# Patient Record
Sex: Male | Born: 1955 | Race: White | Hispanic: No | Marital: Married | Smoking: Never smoker
Health system: Southern US, Community
[De-identification: ages and names within clinical notes are randomized; demographics above are authoritative.]

## PROBLEM LIST (undated history)

## (undated) DIAGNOSIS — Z981 Arthrodesis status: Secondary | ICD-10-CM

## (undated) DIAGNOSIS — E669 Obesity, unspecified: Secondary | ICD-10-CM

## (undated) DIAGNOSIS — M1712 Unilateral primary osteoarthritis, left knee: Secondary | ICD-10-CM

## (undated) DIAGNOSIS — G4730 Sleep apnea, unspecified: Secondary | ICD-10-CM

## (undated) DIAGNOSIS — D123 Benign neoplasm of transverse colon: Secondary | ICD-10-CM

## (undated) DIAGNOSIS — E8881 Metabolic syndrome: Secondary | ICD-10-CM

## (undated) DIAGNOSIS — K621 Rectal polyp: Secondary | ICD-10-CM

## (undated) DIAGNOSIS — E785 Hyperlipidemia, unspecified: Secondary | ICD-10-CM

## (undated) DIAGNOSIS — I10 Essential (primary) hypertension: Secondary | ICD-10-CM

## (undated) DIAGNOSIS — M1990 Unspecified osteoarthritis, unspecified site: Secondary | ICD-10-CM

## (undated) DIAGNOSIS — M4800 Spinal stenosis, site unspecified: Secondary | ICD-10-CM

## (undated) HISTORY — DX: Hyperlipidemia, unspecified: E78.5

## (undated) HISTORY — PX: SPLENECTOMY: SUR1306

## (undated) HISTORY — DX: Essential (primary) hypertension: I10

## (undated) HISTORY — PX: OTHER SURGICAL HISTORY: SHX169

## (undated) HISTORY — PX: TOTAL HIP ARTHROPLASTY: SHX124

---

## 1977-05-07 HISTORY — PX: OTHER SURGICAL HISTORY: SHX169

## 1977-05-07 HISTORY — PX: SPLENECTOMY: SUR1306

## 2011-07-23 ENCOUNTER — Ambulatory Visit: Payer: Self-pay | Admitting: General Practice

## 2011-07-23 LAB — URINALYSIS, COMPLETE
Bacteria: NONE SEEN
Bilirubin,UR: NEGATIVE
Glucose,UR: NEGATIVE mg/dL (ref 0–75)
Ketone: NEGATIVE
Ph: 5 (ref 4.5–8.0)
Specific Gravity: 1.023 (ref 1.003–1.030)
Squamous Epithelial: 1

## 2011-07-23 LAB — PROTIME-INR: Prothrombin Time: 12.3 secs (ref 11.5–14.7)

## 2011-07-23 LAB — BASIC METABOLIC PANEL
Calcium, Total: 8.8 mg/dL (ref 8.5–10.1)
Co2: 25 mmol/L (ref 21–32)
Creatinine: 0.89 mg/dL (ref 0.60–1.30)
EGFR (African American): >60
EGFR (Non-African Amer.): >60
Glucose: 109 mg/dL — ABNORMAL HIGH (ref 65–99)
Osmolality: 289 (ref 275–301)
Sodium: 144 mmol/L (ref 136–145)

## 2011-07-23 LAB — CBC
MCH: 31.6 pg (ref 26.0–34.0)
MCHC: 33.7 g/dL (ref 32.0–36.0)
Platelet: 245 10*3/uL (ref 150–440)
RBC: 4.91 10*6/uL (ref 4.40–5.90)
RDW: 12 % (ref 11.5–14.5)
WBC: 7.5 10*3/uL (ref 3.8–10.6)

## 2011-07-23 LAB — SEDIMENTATION RATE: Erythrocyte Sed Rate: 6 mm/hr (ref 0–20)

## 2011-07-24 LAB — URINE CULTURE

## 2011-08-08 ENCOUNTER — Inpatient Hospital Stay: Payer: Self-pay | Admitting: General Practice

## 2011-08-09 LAB — BASIC METABOLIC PANEL
Calcium, Total: 8 mg/dL — ABNORMAL LOW (ref 8.5–10.1)
Chloride: 106 mmol/L (ref 98–107)
Co2: 23 mmol/L (ref 21–32)
Creatinine: 0.94 mg/dL (ref 0.60–1.30)
EGFR (African American): >60
Glucose: 113 mg/dL — ABNORMAL HIGH (ref 65–99)
Osmolality: 281 (ref 275–301)
Potassium: 4.2 mmol/L (ref 3.5–5.1)

## 2011-08-10 LAB — BASIC METABOLIC PANEL
BUN: 11 mg/dL (ref 7–18)
Calcium, Total: 8 mg/dL — ABNORMAL LOW (ref 8.5–10.1)
EGFR (African American): >60
EGFR (Non-African Amer.): >60
Glucose: 101 mg/dL — ABNORMAL HIGH (ref 65–99)
Potassium: 3.9 mmol/L (ref 3.5–5.1)
Sodium: 138 mmol/L (ref 136–145)

## 2011-08-10 LAB — PLATELET COUNT: Platelet: 209 10*3/uL (ref 150–440)

## 2013-03-05 ENCOUNTER — Ambulatory Visit: Payer: Self-pay | Admitting: Family Medicine

## 2013-04-18 ENCOUNTER — Ambulatory Visit: Payer: Self-pay | Admitting: General Practice

## 2013-07-14 ENCOUNTER — Ambulatory Visit: Payer: Self-pay | Admitting: Anesthesiology

## 2013-07-17 ENCOUNTER — Ambulatory Visit: Payer: Self-pay | Admitting: General Practice

## 2014-08-24 LAB — LIPID PANEL
Cholesterol: 225 mg/dL — AB (ref 0–200)
HDL: 50 mg/dL (ref 35–70)
LDL CALC: 111 mg/dL
Triglycerides: 321 mg/dL — AB (ref 40–160)

## 2014-08-24 LAB — BASIC METABOLIC PANEL
Creatinine: 0.9 mg/dL (ref <=1.3)
Glucose: 93 mg/dL

## 2014-08-28 NOTE — Op Note (Signed)
PATIENT NAME:  Ralph Giles, ROBACK MR#:  [id number] DATE OF BIRTH:  1956-01-30  DATE OF PROCEDURE:  07/17/2013  PREOPERATIVE DIAGNOSIS: Internal derangement of the right knee.   POSTOPERATIVE DIAGNOSES:  1. Tear of the posterior horn of medial meniscus, right knee.  2. 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   PROCEDURE PERFORMED: Right knee arthroscopy, partial medial meniscectomy and chondroplasty of the medial, lateral and patellofemoral compartments.   SURGEON: Laurice Record. Holley Bouche., MD  ANESTHESIA: General.   ESTIMATED BLOOD LOSS: Minimal.   FLUIDS REPLACED: 1200 mL of crystalloid.   DRAINS: None.   TOURNIQUET TIME: Not used.   INDICATIONS FOR SURGERY: The patient is a 59 year old male who has been seen for complaints of persistent right knee pain. MRI demonstrated findings consistent with meniscal pathology. After discussion of the risks and benefits of surgical intervention, the patient expressed understanding of the risks and benefits and agreed with plans for surgical intervention.   PROCEDURE IN DETAIL: The patient was brought into the operating room and, after adequate general anesthesia was achieved, a tourniquet was placed on the patient's right thigh and the leg was placed in a leg holder. All bony prominences were well-padded. The patient's right knee and leg were cleaned and prepped with alcohol and DuraPrep and draped in the usual sterile fashion. A "timeout" was performed as per usual protocol. The anticipated portal sites were injected with 0.25% Marcaine with epinephrine. An anterolateral portal was created, and a cannula was inserted. A small effusion was evacuated. The scope was inserted, and the knee was distended with fluid. The scope was advanced down the medial gutter and into the medial compartment of the knee. Under visualization with the scope, an anteromedial portal was created, and a hook probe was  inserted. Inspection of the medial compartment demonstrated a complex tear involving the posterior horn of the medial meniscus. This was debrided using meniscal punches and a 4.5 mm shaver. Final contouring was performed using a 50 degree ArthroCare wand. The remaining rim was visualized and probed and felt to be stable. The anterior horn of the medial meniscus demonstrated only mild fraying, and this was debrided using the ArthroCare wand. There was some generalized grade 3 chondromalacia involving the medial femoral condyle and medial tibial plateau. These areas were debrided and contoured using the 50 degree ArthroCare wand. The scope was then advanced into the intercondylar region. The anterior cruciate ligament was visualized and probed and felt to be stable. The scope was removed from the anterolateral portal and reinserted via the anteromedial portal so as to better visualize the lateral compartment. The lateral meniscus was visualized and probed and felt to be stable. The articular surface along the lateral tibial plateau was in good condition. There was an area along the lateral femoral condyle with grade 3 changes. This area was debrided and contoured using the 50 degree ArthroCare wand. Finally, the patellofemoral articulation was visualized. Some grade 3 changes were noted, with fibrillation of the articular surface. These areas were debrided and contoured using the 50 degree ArthroCare wand. The knee was irrigated with copious amounts of fluid and suctioned dry. The knee was then injected with a combination of 0.25% Marcaine with epinephrine and 4 mg of morphine via the scope. The scope was removed, and the anteromedial portal was reapproximated using #3-0 nylon. A sterile dressing was applied, followed by application of ice wrap.   The patient tolerated the procedure well. He was transported to the  recovery room in stable condition.    ____________________________ Laurice Record. Holley Bouche.,  MD jph:lb D: 07/17/2013 17:11:04 ET T: 07/18/2013 06:31:52 ET JOB#: [id number]  cc: Laurice Record. Holley Bouche., MD, <Dictator> JAMES P Holley Bouche MD ELECTRONICALLY SIGNED 07/19/2013 8:05
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
## 2014-08-29 NOTE — Op Note (Signed)
PATIENT NAME:  Ralph Giles, Ralph Giles MR#:  [id number] DATE OF BIRTH:  [birth year]  DATE OF PROCEDURE:  08/08/2011  PREOPERATIVE DIAGNOSIS: Degenerative arthrosis of the left hip.   POSTOPERATIVE DIAGNOSIS: Degenerative arthrosis of the left hip.   PROCEDURE PERFORMED: Left total hip arthroplasty.   SURGEON: Laurice Record. Holley Bouche., MD  ASSISTANT: Vance Peper, PA-C (required to maintain retraction throughout the procedure)   ANESTHESIA: Spinal.   ESTIMATED BLOOD LOSS: 200 mL.   FLUIDS REPLACED: 2800 mL of crystalloid.   DRAINS: Two medium drains to Hemovac reservoir.   IMPLANTS UTILIZED: DePuy 13.5 mm small stature AML femoral component, 54 mm Pinnacle Sector 2 acetabular component, two 6.5 mm cancellous bone screws, a 36 mm inner diameter Pinnacle Marathon +4 neutral polyethylene liner, and a 36 mm M-Spec femoral head with a +1.5 mm neck segment.   INDICATIONS FOR SURGERY: The patient is a 59 year old male who has been seen for complaints of progressive left hip pain and limited range of motion. X-rays demonstrated severe degenerative changes. After discussion of the risks and benefits of surgical intervention, the patient expressed his understanding of the risks and benefits and agreed with plans for surgical intervention.   PROCEDURE IN DETAIL: Patient was brought into the Operating Room and, after adequate spinal anesthesia was achieved, the patient was placed in a right lateral decubitus position. Axillary roll was placed and all bony prominences were well padded. The patient's left hip and leg were cleaned and prepped with alcohol and DuraPrep and draped in the usual sterile fashion. A "timeout" was performed as per usual protocol. A lateral curvilinear incision was made gently curving towards the posterior superior iliac spine. IT band was incised in line with the skin incision and the fibers of the gluteus maximus were split in line. Piriformis tendon was identified, skeletonized, and incised  at its insertion to the proximal femur and reflected posteriorly. In a similar fashion, short external rotators were incised and reflected posteriorly. A T-type posterior capsulotomy was performed. Prior to dislocation of the femoral head, a threaded Steinmann pin was inserted through a separate stab incision into the pelvis superior to the acetabulum and bent in the form of a stylus so as to assess limb length and hip offset throughout the procedure. Femoral head was then dislocated posteriorly. Severe degenerative changes were noted with full thickness loss of articular cartilage and eburnated bone noted superiorly. Femoral neck cut was performed using an oscillating saw. Anterior capsule was elevated off of the femoral neck. Inspection of the acetabulum also demonstrated severe degenerative changes. Remnant of the labrum was excised. Osteochondral loose bodies were noted superiorly and anteriorly and these were excised. The acetabulum was reamed in a sequential fashion up to a 53 mm diameter. This allowed for good punctate bleeding bone. A 54 mm outer diameter Pinnacle Sector 2 acetabular component was positioned and impacted in place. Good scratch fit was achieved. Two 6.5 mm cancellous screws were inserted through holes in the dome. A +4 neutral trial polyethylene was inserted and attention was directed to proximal femur. Pilot hole for reaming of the proximal femoral canal was prepared using a high-speed bur. Proximal femoral canal was reamed in a sequential fashion up to a 13 mm diameter. This allowed for approximately 5.5 cm of scratch fit. Proximal portion of the femur was then prepared using 13.5 mm aggressive side-biting reamer. Serial broaches were inserted up to a 13.5 mm small stature broach. Calcar region was planed and trial reduction was performed using a  36 mm hip ball with A +1.5 mm neck segment. This allowed for excellent equalization of leg lengths and excellent stability both anteriorly and  posteriorly. Trial components were removed. The acetabular shell was irrigated with copious amounts of normal saline with antibiotic solution and then suctioned dry. A +4 neutral Pinnacle Marathon polyethylene liner was then positioned and impacted into place. Finally, a 13.5 mm small stature AML femoral component was positioned and impacted in place. Excellent scratch fit was achieved. Trial reduction was again performed with a 36 mm hip ball with a +1.5 mm neck segment. Again, excellent stability was appreciated both anteriorly and posteriorly. Trial hip ball was removed. The [facility] taper was cleaned and dried and a 36 mm outer diameter aSphere hip ball with a +1.5 mm neck segment was placed on the trunnion and impacted in place. Hip was reduced and placed through a range of motion. Again, excellent restoration of limb lengths appreciated as well as excellent anterior and posterior stability.   The wound was irrigated with copious amounts of normal saline with antibiotic solution using pulsatile lavage and then suctioned dry. Good hemostasis was appreciated. Posterior capsulotomy was repaired using #5 Ethibond. Piriformis tendon was reapproximated on the undersurface of the gluteus medius tendon using #5 Ethibond. Gluteal sling was repaired using interrupted sutures of #5 Ethibond. Two medium drains were placed in the wound bed and brought out through a separate stab incision to be attached to a Hemovac reservoir. The IT band was repaired using interrupted sutures of #1 Vicryl. Subcutaneous tissue was approximated in layers using first #0 Vicryl followed by 2-0 Vicryl. Skin was closed with skin staples. Sterile dressing was applied.   Patient tolerated procedure well. He was transported to the recovery room in stable condition.   ____________________________ Laurice Record. Holley Bouche., MD jph:cms D: 08/08/2011 18:58:48 ET T: 08/09/2011 09:06:28 ET JOB#: [id number]  cc: Laurice Record. Holley Bouche., MD, <Dictator> JAMES P  Holley Bouche MD ELECTRONICALLY SIGNED 08/10/2011 0:31

## 2014-09-03 ENCOUNTER — Encounter: Payer: Self-pay | Admitting: Family Medicine

## 2014-09-03 DIAGNOSIS — E8881 Metabolic syndrome: Secondary | ICD-10-CM | POA: Insufficient documentation

## 2014-09-03 DIAGNOSIS — M1710 Unilateral primary osteoarthritis, unspecified knee: Secondary | ICD-10-CM | POA: Insufficient documentation

## 2014-09-03 DIAGNOSIS — E782 Mixed hyperlipidemia: Secondary | ICD-10-CM | POA: Insufficient documentation

## 2014-09-03 DIAGNOSIS — I10 Essential (primary) hypertension: Secondary | ICD-10-CM | POA: Insufficient documentation

## 2014-09-03 DIAGNOSIS — M179 Osteoarthritis of knee, unspecified: Secondary | ICD-10-CM | POA: Insufficient documentation

## 2014-09-03 DIAGNOSIS — IMO0002 Reserved for concepts with insufficient information to code with codable children: Secondary | ICD-10-CM | POA: Insufficient documentation

## 2014-09-03 DIAGNOSIS — Z0000 Encounter for general adult medical examination without abnormal findings: Secondary | ICD-10-CM | POA: Insufficient documentation

## 2014-11-17 ENCOUNTER — Other Ambulatory Visit: Payer: Self-pay

## 2014-11-17 DIAGNOSIS — I10 Essential (primary) hypertension: Secondary | ICD-10-CM

## 2014-11-17 DIAGNOSIS — E785 Hyperlipidemia, unspecified: Secondary | ICD-10-CM

## 2014-11-17 MED ORDER — ATORVASTATIN CALCIUM 20 MG PO TABS: 20.0000 mg | ORAL_TABLET | ORAL | Status: DC

## 2014-11-17 MED ORDER — BENAZEPRIL HCL 40 MG PO TABS: 40.0000 mg | ORAL_TABLET | ORAL | Status: DC

## 2015-02-14 ENCOUNTER — Other Ambulatory Visit: Payer: Self-pay

## 2015-02-14 DIAGNOSIS — I10 Essential (primary) hypertension: Secondary | ICD-10-CM

## 2015-02-14 MED ORDER — BENAZEPRIL HCL 40 MG PO TABS: 40.0000 mg | ORAL_TABLET | ORAL | Status: DC

## 2015-03-01 ENCOUNTER — Ambulatory Visit (INDEPENDENT_AMBULATORY_CARE_PROVIDER_SITE_OTHER): Payer: BLUE CROSS/BLUE SHIELD | Admitting: Family Medicine

## 2015-03-01 ENCOUNTER — Encounter: Payer: Self-pay | Admitting: Family Medicine

## 2015-03-01 VITALS — BP 130/80 | HR 72 | Ht 70.0 in | Wt 275.0 lb

## 2015-03-01 DIAGNOSIS — E785 Hyperlipidemia, unspecified: Secondary | ICD-10-CM

## 2015-03-01 DIAGNOSIS — IMO0002 Reserved for concepts with insufficient information to code with codable children: Secondary | ICD-10-CM

## 2015-03-01 DIAGNOSIS — I10 Essential (primary) hypertension: Secondary | ICD-10-CM | POA: Diagnosis not present

## 2015-03-01 DIAGNOSIS — E668 Other obesity: Secondary | ICD-10-CM

## 2015-03-01 DIAGNOSIS — E8881 Metabolic syndrome: Secondary | ICD-10-CM | POA: Diagnosis not present

## 2015-03-01 DIAGNOSIS — E782 Mixed hyperlipidemia: Secondary | ICD-10-CM | POA: Diagnosis not present

## 2015-03-01 DIAGNOSIS — Z1211 Encounter for screening for malignant neoplasm of colon: Secondary | ICD-10-CM

## 2015-03-01 LAB — HEMOCCULT GUIAC POC 1CARD (OFFICE): Fecal Occult Blood, POC: NEGATIVE

## 2015-03-01 MED ORDER — ATORVASTATIN CALCIUM 20 MG PO TABS: 20.0000 mg | ORAL_TABLET | ORAL | Status: DC

## 2015-03-01 MED ORDER — BENAZEPRIL HCL 40 MG PO TABS: 40.0000 mg | ORAL_TABLET | ORAL | Status: DC

## 2015-03-01 NOTE — Patient Instructions (Signed)
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
## 2015-03-01 NOTE — Addendum Note (Signed)
Addended by: [name] on: 03/01/2015 09:41 AM   Modules accepted: Orders

## 2015-03-02 LAB — RENAL FUNCTION PANEL
Albumin: 4.3 g/dL (ref 3.5–5.5)
BUN / CREAT RATIO: 16 (ref 9–20)
BUN: 17 mg/dL (ref 6–24)
CALCIUM: 9.4 mg/dL (ref 8.7–10.2)
CO2: 25 mmol/L (ref 18–29)
CREATININE: 1.05 mg/dL (ref 0.76–1.27)
Chloride: 102 mmol/L (ref 97–106)
GFR calc non Af Amer: 78 mL/min/{1.73_m2} (ref >59)
GFR, EST AFRICAN AMERICAN: 91 mL/min/{1.73_m2} (ref >59)
Glucose: 85 mg/dL (ref 65–99)
Phosphorus: 3.3 mg/dL (ref 2.5–4.5)
Potassium: 4.8 mmol/L (ref 3.5–5.2)
SODIUM: 143 mmol/L (ref 136–144)

## 2015-03-02 LAB — LIPID PANEL
CHOL/HDL RATIO: 4.9 ratio (ref 0.0–5.0)
Cholesterol, Total: 194 mg/dL (ref 100–199)
HDL: 40 mg/dL (ref >39)
LDL Calculated: 104 mg/dL — ABNORMAL HIGH (ref 0–99)
Triglycerides: 248 mg/dL — ABNORMAL HIGH (ref 0–149)
VLDL Cholesterol Cal: 50 mg/dL — ABNORMAL HIGH (ref 5–40)

## 2015-05-23 ENCOUNTER — Other Ambulatory Visit: Payer: Self-pay

## 2015-06-16 ENCOUNTER — Other Ambulatory Visit: Payer: Self-pay

## 2015-06-16 ENCOUNTER — Other Ambulatory Visit: Payer: Self-pay | Admitting: Family Medicine

## 2015-09-21 ENCOUNTER — Other Ambulatory Visit: Payer: Self-pay

## 2015-09-21 DIAGNOSIS — I10 Essential (primary) hypertension: Secondary | ICD-10-CM

## 2015-09-21 MED ORDER — BENAZEPRIL HCL 40 MG PO TABS: 40.0000 mg | ORAL_TABLET | Freq: Every day | ORAL | Status: DC

## 2015-10-04 ENCOUNTER — Other Ambulatory Visit: Payer: Self-pay

## 2015-10-04 DIAGNOSIS — E785 Hyperlipidemia, unspecified: Secondary | ICD-10-CM

## 2015-10-04 DIAGNOSIS — I10 Essential (primary) hypertension: Secondary | ICD-10-CM

## 2015-10-04 MED ORDER — BENAZEPRIL HCL 40 MG PO TABS: 40.0000 mg | ORAL_TABLET | Freq: Every day | ORAL | Status: DC

## 2015-10-04 MED ORDER — ATORVASTATIN CALCIUM 20 MG PO TABS: 20.0000 mg | ORAL_TABLET | ORAL | Status: DC

## 2015-10-11 ENCOUNTER — Encounter: Payer: Self-pay | Admitting: Family Medicine

## 2015-10-11 ENCOUNTER — Ambulatory Visit (INDEPENDENT_AMBULATORY_CARE_PROVIDER_SITE_OTHER): Payer: BLUE CROSS/BLUE SHIELD | Admitting: Family Medicine

## 2015-10-11 VITALS — BP 130/80 | HR 80 | Ht 70.0 in | Wt 279.0 lb

## 2015-10-11 DIAGNOSIS — E785 Hyperlipidemia, unspecified: Secondary | ICD-10-CM

## 2015-10-11 DIAGNOSIS — I10 Essential (primary) hypertension: Secondary | ICD-10-CM | POA: Diagnosis not present

## 2015-10-11 DIAGNOSIS — E782 Mixed hyperlipidemia: Secondary | ICD-10-CM

## 2015-10-11 DIAGNOSIS — Z23 Encounter for immunization: Secondary | ICD-10-CM

## 2015-10-11 MED ORDER — BENAZEPRIL HCL 40 MG PO TABS: 40.0000 mg | ORAL_TABLET | Freq: Every day | ORAL | Status: DC

## 2015-10-11 MED ORDER — ATORVASTATIN CALCIUM 20 MG PO TABS: 20.0000 mg | ORAL_TABLET | ORAL | Status: DC
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
## 2015-10-12 LAB — LIPID PANEL
CHOL/HDL RATIO: 5.8 ratio — AB (ref 0.0–5.0)
Cholesterol, Total: 190 mg/dL (ref 100–199)
HDL: 33 mg/dL — AB (ref >39)
TRIGLYCERIDES: 408 mg/dL — AB (ref 0–149)

## 2015-10-12 LAB — RENAL FUNCTION PANEL
ALBUMIN: 4.2 g/dL (ref 3.5–5.5)
BUN/Creatinine Ratio: 15 (ref 9–20)
BUN: 14 mg/dL (ref 6–24)
CO2: 23 mmol/L (ref 18–29)
CREATININE: 0.93 mg/dL (ref 0.76–1.27)
Calcium: 9.3 mg/dL (ref 8.7–10.2)
Chloride: 99 mmol/L (ref 96–106)
GFR, EST AFRICAN AMERICAN: 104 mL/min/{1.73_m2} (ref >59)
GFR, EST NON AFRICAN AMERICAN: 90 mL/min/{1.73_m2} (ref >59)
GLUCOSE: 73 mg/dL (ref 65–99)
PHOSPHORUS: 2.9 mg/dL (ref 2.5–4.5)
POTASSIUM: 4.3 mmol/L (ref 3.5–5.2)
Sodium: 141 mmol/L (ref 134–144)

## 2016-03-20 ENCOUNTER — Other Ambulatory Visit: Payer: Self-pay

## 2016-04-20 ENCOUNTER — Other Ambulatory Visit: Payer: Self-pay

## 2016-05-19 ENCOUNTER — Other Ambulatory Visit: Payer: Self-pay | Admitting: Family Medicine

## 2016-05-19 DIAGNOSIS — E785 Hyperlipidemia, unspecified: Secondary | ICD-10-CM

## 2016-06-18 ENCOUNTER — Other Ambulatory Visit: Payer: Self-pay

## 2016-06-19 ENCOUNTER — Other Ambulatory Visit: Payer: Self-pay

## 2016-06-23 ENCOUNTER — Other Ambulatory Visit: Payer: Self-pay | Admitting: Family Medicine

## 2016-06-23 DIAGNOSIS — E785 Hyperlipidemia, unspecified: Secondary | ICD-10-CM

## 2016-06-26 ENCOUNTER — Encounter: Payer: Self-pay | Admitting: Family Medicine

## 2016-06-26 ENCOUNTER — Ambulatory Visit (INDEPENDENT_AMBULATORY_CARE_PROVIDER_SITE_OTHER): Payer: BLUE CROSS/BLUE SHIELD | Admitting: Family Medicine

## 2016-06-26 ENCOUNTER — Other Ambulatory Visit: Payer: Self-pay | Admitting: Family Medicine

## 2016-06-26 VITALS — BP 130/80 | HR 80 | Ht 70.0 in | Wt 274.0 lb

## 2016-06-26 DIAGNOSIS — Z1159 Encounter for screening for other viral diseases: Secondary | ICD-10-CM | POA: Diagnosis not present

## 2016-06-26 DIAGNOSIS — E6609 Other obesity due to excess calories: Secondary | ICD-10-CM

## 2016-06-26 DIAGNOSIS — Z1211 Encounter for screening for malignant neoplasm of colon: Secondary | ICD-10-CM | POA: Diagnosis not present

## 2016-06-26 DIAGNOSIS — E782 Mixed hyperlipidemia: Secondary | ICD-10-CM | POA: Diagnosis not present

## 2016-06-26 DIAGNOSIS — I10 Essential (primary) hypertension: Secondary | ICD-10-CM

## 2016-06-26 DIAGNOSIS — E781 Pure hyperglyceridemia: Secondary | ICD-10-CM | POA: Insufficient documentation

## 2016-06-26 DIAGNOSIS — E8881 Metabolic syndrome: Secondary | ICD-10-CM

## 2016-06-26 DIAGNOSIS — R739 Hyperglycemia, unspecified: Secondary | ICD-10-CM | POA: Diagnosis not present

## 2016-06-26 DIAGNOSIS — Z23 Encounter for immunization: Secondary | ICD-10-CM

## 2016-06-26 DIAGNOSIS — Z9081 Acquired absence of spleen: Secondary | ICD-10-CM

## 2016-06-26 DIAGNOSIS — Z6839 Body mass index (BMI) 39.0-39.9, adult: Secondary | ICD-10-CM

## 2016-06-26 LAB — HEMOCCULT GUIAC POC 1CARD (OFFICE): FECAL OCCULT BLD: NEGATIVE

## 2016-06-26 MED ORDER — BENAZEPRIL HCL 40 MG PO TABS: 40.0000 mg | ORAL_TABLET | Freq: Every day | ORAL | 3 refills | Status: DC

## 2016-06-26 MED ORDER — ATORVASTATIN CALCIUM 20 MG PO TABS: ORAL_TABLET | ORAL | 3 refills | Status: DC
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
## 2016-06-28 LAB — RENAL FUNCTION PANEL
Albumin: 4.3 g/dL (ref 3.5–5.5)
BUN / CREAT RATIO: 14 (ref 9–20)
BUN: 13 mg/dL (ref 6–24)
CHLORIDE: 103 mmol/L (ref 96–106)
CO2: 26 mmol/L (ref 18–29)
CREATININE: 0.9 mg/dL (ref 0.76–1.27)
Calcium: 9.1 mg/dL (ref 8.7–10.2)
GFR, EST AFRICAN AMERICAN: 108 (ref >59)
GFR, EST NON AFRICAN AMERICAN: 93 (ref >59)
Glucose: 86 mg/dL (ref 65–99)
Phosphorus: 2.7 mg/dL (ref 2.5–4.5)
Potassium: 4.7 mmol/L (ref 3.5–5.2)
Sodium: 142 mmol/L (ref 134–144)

## 2016-06-28 LAB — HEMOGLOBIN A1C
ESTIMATED AVERAGE GLUCOSE: 120
HEMOGLOBIN A1C: 5.8 % — AB (ref 4.8–5.6)

## 2016-06-28 LAB — LIPID PANEL
CHOL/HDL RATIO: 5.2 — AB (ref 0.0–5.0)
Cholesterol, Total: 181 mg/dL (ref 100–199)
HDL: 35 mg/dL — ABNORMAL LOW (ref >39)
LDL CALC: 78 (ref 0–99)
Triglycerides: 339 mg/dL — ABNORMAL HIGH (ref 0–149)
VLDL CHOLESTEROL CAL: 68 — AB (ref 5–40)

## 2016-06-28 LAB — HEPATITIS C ANTIBODY: Hep C Virus Ab: <0.1 s/co ratio (ref 0.0–0.9)

## 2016-10-02 ENCOUNTER — Other Ambulatory Visit: Payer: Self-pay | Admitting: Family Medicine

## 2016-10-11 ENCOUNTER — Other Ambulatory Visit: Payer: Self-pay

## 2016-10-11 ENCOUNTER — Other Ambulatory Visit (INDEPENDENT_AMBULATORY_CARE_PROVIDER_SITE_OTHER): Payer: BLUE CROSS/BLUE SHIELD

## 2016-10-11 DIAGNOSIS — H6123 Impacted cerumen, bilateral: Secondary | ICD-10-CM | POA: Diagnosis not present

## 2017-04-19 ENCOUNTER — Ambulatory Visit: Payer: BLUE CROSS/BLUE SHIELD | Admitting: Family Medicine

## 2017-06-17 ENCOUNTER — Other Ambulatory Visit: Payer: Self-pay | Admitting: Family Medicine

## 2017-06-17 DIAGNOSIS — I10 Essential (primary) hypertension: Secondary | ICD-10-CM

## 2017-06-17 DIAGNOSIS — E782 Mixed hyperlipidemia: Secondary | ICD-10-CM

## 2017-06-24 ENCOUNTER — Other Ambulatory Visit: Payer: Self-pay

## 2017-06-24 ENCOUNTER — Telehealth: Payer: Self-pay

## 2017-06-24 DIAGNOSIS — I10 Essential (primary) hypertension: Secondary | ICD-10-CM

## 2017-06-24 MED ORDER — BENAZEPRIL HCL 40 MG PO TABS: 40.0000 mg | ORAL_TABLET | Freq: Every day | ORAL | 0 refills | Status: DC

## 2017-06-24 NOTE — Telephone Encounter (Signed)
Wife called stating that pt has stomach bug and can't come in tomorrow for appt- sent in 7 days of b/p med- will need to see on the 26th

## 2017-06-25 ENCOUNTER — Ambulatory Visit: Payer: BLUE CROSS/BLUE SHIELD | Admitting: Family Medicine

## 2017-06-25 ENCOUNTER — Other Ambulatory Visit: Payer: Self-pay

## 2017-07-02 ENCOUNTER — Encounter: Payer: Self-pay | Admitting: Family Medicine

## 2017-07-02 ENCOUNTER — Ambulatory Visit (INDEPENDENT_AMBULATORY_CARE_PROVIDER_SITE_OTHER): Payer: BLUE CROSS/BLUE SHIELD | Admitting: Family Medicine

## 2017-07-02 VITALS — BP 138/88 | HR 80 | Ht 70.0 in | Wt 278.0 lb

## 2017-07-02 DIAGNOSIS — Z1211 Encounter for screening for malignant neoplasm of colon: Secondary | ICD-10-CM

## 2017-07-02 DIAGNOSIS — E782 Mixed hyperlipidemia: Secondary | ICD-10-CM

## 2017-07-02 DIAGNOSIS — I10 Essential (primary) hypertension: Secondary | ICD-10-CM

## 2017-07-02 DIAGNOSIS — M1711 Unilateral primary osteoarthritis, right knee: Secondary | ICD-10-CM

## 2017-07-02 LAB — HEMOCCULT GUIAC POC 1CARD (OFFICE): Fecal Occult Blood, POC: NEGATIVE

## 2017-07-02 MED ORDER — ATORVASTATIN CALCIUM 20 MG PO TABS: ORAL_TABLET | ORAL | 1 refills | Status: DC

## 2017-07-02 MED ORDER — BENAZEPRIL HCL 40 MG PO TABS: 40.0000 mg | ORAL_TABLET | Freq: Every day | ORAL | 1 refills | Status: DC
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
## 2017-07-03 LAB — LIPID PANEL
CHOLESTEROL TOTAL: 143 mg/dL (ref 100–199)
Chol/HDL Ratio: 4.5 ratio (ref 0.0–5.0)
HDL: 32 mg/dL — ABNORMAL LOW (ref >39)
LDL Calculated: 82 mg/dL (ref 0–99)
Triglycerides: 146 mg/dL (ref 0–149)
VLDL Cholesterol Cal: 29 mg/dL (ref 5–40)

## 2017-07-03 LAB — RENAL FUNCTION PANEL
ALBUMIN: 4.3 g/dL (ref 3.6–4.8)
BUN/Creatinine Ratio: 17 (ref 10–24)
BUN: 18 mg/dL (ref 8–27)
CHLORIDE: 104 mmol/L (ref 96–106)
CO2: 24 mmol/L (ref 20–29)
Calcium: 9.7 mg/dL (ref 8.6–10.2)
Creatinine, Ser: 1.03 mg/dL (ref 0.76–1.27)
GFR calc Af Amer: 91 mL/min/{1.73_m2} (ref >59)
GFR, EST NON AFRICAN AMERICAN: 79 mL/min/{1.73_m2} (ref >59)
GLUCOSE: 83 mg/dL (ref 65–99)
PHOSPHORUS: 3.6 mg/dL (ref 2.5–4.5)
POTASSIUM: 5.2 mmol/L (ref 3.5–5.2)
Sodium: 145 mmol/L — ABNORMAL HIGH (ref 134–144)

## 2017-07-04 ENCOUNTER — Telehealth: Payer: Self-pay

## 2017-07-04 NOTE — Telephone Encounter (Signed)
Gastroenterology Pre-Procedure Review  Request Date: 07/19/17 Requesting Physician: Dr. Allen Norris   PATIENT REVIEW QUESTIONS: The patient responded to the following health history questions as indicated:    1. Are you having any GI issues? No  2. Do you have a personal history of Polyps? No  3. Do you have a family history of Colon Cancer or Polyps? No  4. Diabetes Mellitus? No  5. Joint replacements in the past 12 months? No  6. Major health problems in the past 3 months? No  7. Any artificial heart valves, MVP, or defibrillator? No     MEDICATIONS & ALLERGIES:    Patient reports the following regarding taking any anticoagulation/antiplatelet therapy:   Plavix, Coumadin, Eliquis, Xarelto, Lovenox, Pradaxa, Brilinta, or Effient? No  Aspirin? Yes, Amlodipine   Patient confirms/reports the following medications:  Current Outpatient Medications  Medication Sig Dispense Refill  . atorvastatin (LIPITOR) 20 MG tablet 1 a day 90 tablet 1  . benazepril (LOTENSIN) 40 MG tablet Take 1 tablet (40 mg total) by mouth daily. 90 tablet 1   No current facility-administered medications for this visit.     Patient confirms/reports the following allergies:  Allergies  Allergen Reactions  . Amlodipine     No orders of the defined types were placed in this encounter.   AUTHORIZATION INFORMATION Primary Insurance: 1D#: Group #:  Secondary Insurance: 1D#: Group #:  SCHEDULE INFORMATION: Date: 07/19/17 Time: Location: [place]

## 2017-07-06 ENCOUNTER — Other Ambulatory Visit: Payer: Self-pay

## 2017-07-06 DIAGNOSIS — Z1211 Encounter for screening for malignant neoplasm of colon: Secondary | ICD-10-CM

## 2017-07-15 ENCOUNTER — Other Ambulatory Visit: Payer: Self-pay

## 2017-07-15 ENCOUNTER — Encounter: Payer: Self-pay | Admitting: *Deleted

## 2017-07-17 NOTE — Discharge Instructions (Signed)
General Anesthesia, Adult, Care After °These instructions provide you with information about caring for yourself after your procedure. Your health care provider may also give you more specific instructions. Your treatment has been planned according to current medical practices, but problems sometimes occur. Call your health care provider if you have any problems or questions after your procedure. °What can I expect after the procedure? °After the procedure, it is common to have: °· Vomiting. °· A sore throat. °· Mental slowness. ° °It is common to feel: °· Nauseous. °· Cold or shivery. °· Sleepy. °· Tired. °· Sore or achy, even in parts of your body where you did not have surgery. ° °Follow these instructions at home: °For at least 24 hours after the procedure: °· Do not: °? Participate in activities where you could fall or become injured. °? Drive. °? Use heavy machinery. °? Drink alcohol. °? Take sleeping pills or medicines that cause drowsiness. °? Make important decisions or sign legal documents. °? Take care of children on your own. °· Rest. °Eating and drinking °· If you vomit, drink water, juice, or soup when you can drink without vomiting. °· Drink enough fluid to keep your urine clear or pale yellow. °· Make sure you have little or no nausea before eating solid foods. °· Follow the diet recommended by your health care provider. °General instructions °· Have a responsible adult stay with you until you are awake and alert. °· Return to your normal activities as told by your health care provider. Ask your health care provider what activities are safe for you. °· Take over-the-counter and prescription medicines only as told by your health care provider. °· If you smoke, do not smoke without supervision. °· Keep all follow-up visits as told by your health care provider. This is important. °Contact a health care provider if: °· You continue to have nausea or vomiting at home, and medicines are not helpful. °· You  cannot drink fluids or start eating again. °· You cannot urinate after 8-12 hours. °· You develop a skin rash. °· You have fever. °· You have increasing redness at the site of your procedure. °Get help right away if: °· You have difficulty breathing. °· You have chest pain. °· You have unexpected bleeding. °· You feel that you are having a life-threatening or urgent problem. °This information is not intended to replace advice given to you by your health care provider. Make sure you discuss any questions you have with your health care provider. °Document Released: 07/30/2000 Document Revised: 09/26/2015 Document Reviewed: 04/07/2015 °Elsevier Interactive Patient Education © 2018 [employer] ° °

## 2017-07-19 ENCOUNTER — Ambulatory Visit
Admission: RE | Admit: 2017-07-19 | Discharge: 2017-07-19 | Disposition: A | Discharge status: Home / Self Care | Payer: BLUE CROSS/BLUE SHIELD | Source: Ambulatory Visit | Attending: Gastroenterology | Admitting: Gastroenterology

## 2017-07-19 ENCOUNTER — Ambulatory Visit: Payer: BLUE CROSS/BLUE SHIELD | Admitting: Anesthesiology

## 2017-07-19 ENCOUNTER — Encounter
Admission: RE | Disposition: A | Discharge status: Home / Self Care | Payer: Self-pay | Source: Ambulatory Visit | Attending: Gastroenterology

## 2017-07-19 DIAGNOSIS — E785 Hyperlipidemia, unspecified: Secondary | ICD-10-CM | POA: Insufficient documentation

## 2017-07-19 DIAGNOSIS — I10 Essential (primary) hypertension: Secondary | ICD-10-CM | POA: Diagnosis not present

## 2017-07-19 DIAGNOSIS — K621 Rectal polyp: Secondary | ICD-10-CM | POA: Diagnosis not present

## 2017-07-19 DIAGNOSIS — K635 Polyp of colon: Secondary | ICD-10-CM | POA: Insufficient documentation

## 2017-07-19 DIAGNOSIS — D123 Benign neoplasm of transverse colon: Secondary | ICD-10-CM | POA: Diagnosis not present

## 2017-07-19 DIAGNOSIS — Z87891 Personal history of nicotine dependence: Secondary | ICD-10-CM | POA: Insufficient documentation

## 2017-07-19 DIAGNOSIS — K641 Second degree hemorrhoids: Secondary | ICD-10-CM | POA: Diagnosis not present

## 2017-07-19 DIAGNOSIS — Z79899 Other long term (current) drug therapy: Secondary | ICD-10-CM | POA: Insufficient documentation

## 2017-07-19 DIAGNOSIS — Z1211 Encounter for screening for malignant neoplasm of colon: Secondary | ICD-10-CM | POA: Diagnosis present

## 2017-07-19 HISTORY — DX: Unspecified osteoarthritis, unspecified site: M19.90

## 2017-07-19 HISTORY — PX: COLONOSCOPY WITH PROPOFOL: SHX5780

## 2017-07-19 SURGERY — COLONOSCOPY WITH PROPOFOL
Anesthesia: General | Wound class: Contaminated

## 2017-07-19 MED ORDER — LIDOCAINE HCL (CARDIAC) 20 MG/ML IV SOLN
INTRAVENOUS | Status: DC | PRN
  Administered 2017-07-19: 40 mg via INTRAVENOUS

## 2017-07-19 MED ORDER — OXYCODONE HCL 5 MG PO TABS: 5.0000 mg | ORAL_TABLET | Freq: Once | ORAL | Status: DC | PRN

## 2017-07-19 MED ORDER — OXYCODONE HCL 5 MG/5ML PO SOLN: 5.0000 mg | Freq: Once | ORAL | Status: DC | PRN

## 2017-07-19 MED ORDER — LACTATED RINGERS IV SOLN
INTRAVENOUS | Status: DC
  Administered 2017-07-19: 08:00:00 via INTRAVENOUS

## 2017-07-19 MED ORDER — PROPOFOL 10 MG/ML IV BOLUS
INTRAVENOUS | Status: DC | PRN
  Administered 2017-07-19: 30 mg via INTRAVENOUS
  Administered 2017-07-19: 100 mg via INTRAVENOUS
  Administered 2017-07-19: 20 mg via INTRAVENOUS
  Administered 2017-07-19: 30 mg via INTRAVENOUS
  Administered 2017-07-19: 40 mg via INTRAVENOUS
  Administered 2017-07-19: 30 mg via INTRAVENOUS

## 2017-07-19 SURGICAL SUPPLY — 24 items
CANISTER SUCT 1200ML W/VALVE (MISCELLANEOUS) ×2 IMPLANT
CLIP HMST 235XBRD CATH ROT (MISCELLANEOUS) IMPLANT
CLIP RESOLUTION 360 11X235 (MISCELLANEOUS)
ELECT REM PT RETURN 9FT ADLT (ELECTROSURGICAL)
ELECTRODE REM PT RTRN 9FT ADLT (ELECTROSURGICAL) IMPLANT
FCP ESCP3.2XJMB 240X2.8X (MISCELLANEOUS)
FORCEPS BIOP RAD 4 LRG CAP 4 (CUTTING FORCEPS) IMPLANT
FORCEPS BIOP RJ4 240 W/NDL (MISCELLANEOUS)
FORCEPS ESCP3.2XJMB 240X2.8X (MISCELLANEOUS) IMPLANT
GOWN CVR UNV OPN BCK APRN NK (MISCELLANEOUS) ×2 IMPLANT
GOWN ISOL THUMB LOOP REG UNIV (MISCELLANEOUS) ×2
INJECTOR VARIJECT VIN23 (MISCELLANEOUS) IMPLANT
KIT DEFENDO VALVE AND CONN (KITS) IMPLANT
KIT ENDO PROCEDURE OLY (KITS) ×2 IMPLANT
MARKER SPOT ENDO TATTOO 5ML (MISCELLANEOUS) IMPLANT
PROBE APC STR FIRE (PROBE) IMPLANT
RETRIEVER NET ROTH 2.5X230 LF (MISCELLANEOUS) IMPLANT
SNARE SHORT THROW 13M SML OVAL (MISCELLANEOUS) IMPLANT
SNARE SHORT THROW 30M LRG OVAL (MISCELLANEOUS) IMPLANT
SNARE SNG USE RND 15MM (INSTRUMENTS) IMPLANT
SPOT EX ENDOSCOPIC TATTOO (MISCELLANEOUS)
TRAP ETRAP POLY (MISCELLANEOUS) IMPLANT
VARIJECT INJECTOR VIN23 (MISCELLANEOUS)
WATER STERILE IRR 250ML POUR (IV SOLUTION) ×2 IMPLANT

## 2017-07-19 NOTE — Anesthesia Procedure Notes (Signed)
Performed by: Janziel Hockett, CRNA Pre-anesthesia Checklist: Patient identified, Emergency Drugs available, Suction available, Timeout performed and Patient being monitored Patient Re-evaluated:Patient Re-evaluated prior to induction Oxygen Delivery Method: Nasal cannula Placement Confirmation: positive ETCO2       
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
## 2017-07-19 NOTE — Op Note (Signed)
[facility] Gastroenterology Patient Name: [facility] Procedure Date: 07/19/2017 8:01 AM MRN: [id number] Account #: [id number] Date of Birth: 08/10/1955 Admit Type: Outpatient Age: 62 Room: [facility] OR ROOM 01 Gender: Male Note Status: Finalized Procedure:            Colonoscopy Indications:          Screening for colorectal malignant neoplasm Providers:            [name] MD, MD Referring MD:         [name], MD (Referring MD) Medicines:            Propofol per Anesthesia Complications:        No immediate complications. Procedure:            Pre-Anesthesia Assessment:                       - Prior to the procedure, a History and Physical was                        performed, and patient medications and allergies were                        reviewed. The patient's tolerance of previous                        anesthesia was also reviewed. The risks and benefits of                        the procedure and the sedation options and risks were                        discussed with the patient. All questions were                        answered, and informed consent was obtained. Prior                        Anticoagulants: The patient has taken no previous                        anticoagulant or antiplatelet agents. ASA Grade                        Assessment: II - A patient with mild systemic disease.                        After reviewing the risks and benefits, the patient was                        deemed in satisfactory condition to undergo the                        procedure.                       After obtaining informed consent, the colonoscope was                        passed under direct vision. Throughout the procedure,  the patient's blood pressure, pulse, and oxygen                        saturations were monitored continuously. The Olympus                        CF-HQ190L Colonoscope (S#. [id number]) was introduced                         through the anus and advanced to the the cecum,                        identified by appendiceal orifice and ileocecal valve.                        The colonoscopy was performed without difficulty. The                        patient tolerated the procedure well. The quality of                        the bowel preparation was good. Findings:      The perianal and digital rectal examinations were normal.      A 4 mm polyp was found in the transverse colon. The polyp was sessile.       The polyp was removed with a cold snare. Resection and retrieval were       complete.      A 2 mm polyp was found in the transverse colon. The polyp was sessile.       The polyp was removed with a cold biopsy forceps. Resection and       retrieval were complete.      A 3 mm polyp was found in the rectum. The polyp was sessile. The polyp       was removed with a cold biopsy forceps. Resection and retrieval were       complete.      Non-bleeding internal hemorrhoids were found during retroflexion. The       hemorrhoids were Grade II (internal hemorrhoids that prolapse but reduce       spontaneously). Impression:           - One 4 mm polyp in the transverse colon, removed with                        a cold snare. Resected and retrieved.                       - One 2 mm polyp in the transverse colon, removed with                        a cold biopsy forceps. Resected and retrieved.                       - One 3 mm polyp in the rectum, removed with a cold                        biopsy forceps. Resected and retrieved.                       - Non-bleeding  internal hemorrhoids. Recommendation:       - Discharge patient to home.                       - Resume previous diet.                       - Continue present medications.                       - Await pathology results.                       - Repeat colonoscopy in 5 years if polyp adenoma and 10                        years if  hyperplastic Procedure Code(s):    --- Professional ---                       [phone], Colonoscopy, flexible; with removal of tumor(s),                        polyp(s), or other lesion(s) by snare technique                       [id number], 39, Colonoscopy, flexible; with biopsy, single                        or multiple Diagnosis Code(s):    --- Professional ---                       Z12.11, Encounter for screening for malignant neoplasm                        of colon                       K62.1, Rectal polyp                       D12.3, Benign neoplasm of transverse colon (hepatic                        flexure or splenic flexure) CPT copyright 2016 American Medical Association. All rights reserved. The codes documented in this report are preliminary and upon coder review may  be revised to meet current compliance requirements. [name] MD, MD 07/19/2017 8:43:24 AM This report has been signed electronically. Number of Addenda: 0 Note Initiated On: 07/19/2017 8:01 AM Scope Withdrawal Time: 0 hours 10 minutes 49 seconds  Total Procedure Duration: 0 hours 19 minutes 10 seconds       [facility]

## 2017-07-19 NOTE — Transfer of Care (Signed)
Immediate Anesthesia Transfer of Care Note  Patient: Ralph Giles  Procedure(s) Performed: COLONOSCOPY WITH PROPOFOL (N/A )  Patient Location: PACU  Anesthesia Type: General  Level of Consciousness: awake, alert  and patient cooperative  Airway and Oxygen Therapy: Patient Spontanous Breathing and Patient connected to supplemental oxygen  Post-op Assessment: Post-op Vital signs reviewed, Patient's Cardiovascular Status Stable, Respiratory Function Stable, Patent Airway and No signs of Nausea or vomiting  Post-op Vital Signs: Reviewed and stable  Complications: No apparent anesthesia complications

## 2017-07-19 NOTE — Anesthesia Postprocedure Evaluation (Signed)
Anesthesia Post Note  Patient: Ralph Giles  Procedure(s) Performed: COLONOSCOPY WITH PROPOFOL (N/A )  Patient location during evaluation: PACU Anesthesia Type: General Level of consciousness: awake and alert Pain management: pain level controlled Vital Signs Assessment: post-procedure vital signs reviewed and stable Respiratory status: spontaneous breathing, nonlabored ventilation, respiratory function stable and patient connected to nasal cannula oxygen Cardiovascular status: blood pressure returned to baseline and stable Postop Assessment: no apparent nausea or vomiting Anesthetic complications: no    Yuriko Portales

## 2017-07-22 ENCOUNTER — Encounter: Payer: Self-pay | Admitting: Gastroenterology

## 2017-07-23 ENCOUNTER — Encounter: Payer: Self-pay | Admitting: Gastroenterology

## 2017-07-25 ENCOUNTER — Other Ambulatory Visit: Payer: Self-pay

## 2017-10-08 ENCOUNTER — Other Ambulatory Visit: Payer: Self-pay | Admitting: Podiatry

## 2017-10-08 ENCOUNTER — Ambulatory Visit (INDEPENDENT_AMBULATORY_CARE_PROVIDER_SITE_OTHER): Payer: BLUE CROSS/BLUE SHIELD

## 2017-10-08 ENCOUNTER — Encounter: Payer: Self-pay | Admitting: Podiatry

## 2017-10-08 ENCOUNTER — Ambulatory Visit (INDEPENDENT_AMBULATORY_CARE_PROVIDER_SITE_OTHER): Payer: BLUE CROSS/BLUE SHIELD | Admitting: Podiatry

## 2017-10-08 DIAGNOSIS — M65971 Unspecified synovitis and tenosynovitis, right ankle and foot: Secondary | ICD-10-CM

## 2017-10-08 DIAGNOSIS — M779 Enthesopathy, unspecified: Secondary | ICD-10-CM

## 2017-10-08 DIAGNOSIS — S93401D Sprain of unspecified ligament of right ankle, subsequent encounter: Secondary | ICD-10-CM

## 2017-10-08 DIAGNOSIS — M659 Synovitis and tenosynovitis, unspecified: Secondary | ICD-10-CM | POA: Diagnosis not present

## 2017-10-08 MED ORDER — METHYLPREDNISOLONE 4 MG PO TBPK: ORAL_TABLET | ORAL | 0 refills | Status: DC

## 2017-10-08 MED ORDER — MELOXICAM 15 MG PO TABS: 15.0000 mg | ORAL_TABLET | Freq: Every day | ORAL | 1 refills | Status: AC

## 2017-10-08 NOTE — Progress Notes (Signed)
   Subjective:  62 year old male presenting today as a new patient with a chief complaint of constant burning, sharp, stabbing pain of the right ankle that began 5 weeks ago. He states he twisted the ankle at onset of the symptoms. He states the pain radiates down the medial and lateral sides of the right foot. He reports associated swelling. Walking and standing increase the symptoms. He has been wearing an ankle brace, icing the ankle and taking Aleve with no significant relief. Patient is here for further evaluation and treatment.   Past Medical History:  Diagnosis Date  . Arthritis    right knee  . Hyperlipidemia   . Hypertension        Objective / Physical Exam:  General:  The patient is alert and oriented x3 in no acute distress. Dermatology:  Skin is warm, dry and supple bilateral lower extremities. Negative for open lesions or macerations. Vascular:  Palpable pedal pulses bilaterally. No edema or erythema noted. Capillary refill within normal limits. Neurological:  Epicritic and protective threshold grossly intact bilaterally.  Musculoskeletal Exam:  Pain on palpation to the anterior lateral medial aspects of the patient's right ankle. Mild edema noted. Range of motion within normal limits to all pedal and ankle joints bilateral. Muscle strength 5/5 in all groups bilateral.   Radiographic Exam:  Normal osseous mineralization. Joint spaces preserved. No fracture/dislocation/boney destruction.    Assessment: 1. pain in right ankle 2. synovitis of right ankle secondary to sprain  Plan of Care:  1. Patient was evaluated. X-Rays reviewed.  2. injection of 0.5 mL Celestone Soluspan injected in the patient's right ankle. 3. Prescription for Medrol Dose Pak provided to patient.  4. Prescription for Meloxicam provided to patient.  5. Ankle brace dispensed.  6. Return to clinic in 4 weeks.   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   Edrick Kins, DPM [facility]  Dr[address] Edrick Kins, [address]                                        [place], [place] [address]                Office [phone]  Fax [phone]

## 2017-11-12 ENCOUNTER — Ambulatory Visit: Payer: BLUE CROSS/BLUE SHIELD | Admitting: Podiatry

## 2017-12-10 ENCOUNTER — Ambulatory Visit: Payer: BLUE CROSS/BLUE SHIELD | Admitting: Podiatry

## 2017-12-23 ENCOUNTER — Other Ambulatory Visit: Payer: Self-pay | Admitting: Family Medicine

## 2017-12-23 DIAGNOSIS — I10 Essential (primary) hypertension: Secondary | ICD-10-CM

## 2017-12-23 DIAGNOSIS — E782 Mixed hyperlipidemia: Secondary | ICD-10-CM

## 2017-12-31 ENCOUNTER — Encounter: Payer: Self-pay | Admitting: Family Medicine

## 2017-12-31 ENCOUNTER — Ambulatory Visit (INDEPENDENT_AMBULATORY_CARE_PROVIDER_SITE_OTHER): Payer: BLUE CROSS/BLUE SHIELD | Admitting: Family Medicine

## 2017-12-31 VITALS — BP 130/82 | HR 80 | Ht 70.0 in | Wt 276.0 lb

## 2017-12-31 DIAGNOSIS — Z23 Encounter for immunization: Secondary | ICD-10-CM

## 2017-12-31 DIAGNOSIS — E782 Mixed hyperlipidemia: Secondary | ICD-10-CM

## 2017-12-31 DIAGNOSIS — N529 Male erectile dysfunction, unspecified: Secondary | ICD-10-CM

## 2017-12-31 DIAGNOSIS — I10 Essential (primary) hypertension: Secondary | ICD-10-CM

## 2017-12-31 DIAGNOSIS — Z3009 Encounter for other general counseling and advice on contraception: Secondary | ICD-10-CM

## 2017-12-31 MED ORDER — SILDENAFIL CITRATE 20 MG PO TABS: 20.0000 mg | ORAL_TABLET | Freq: Three times a day (TID) | ORAL | 11 refills | Status: DC

## 2017-12-31 MED ORDER — ATORVASTATIN CALCIUM 20 MG PO TABS: ORAL_TABLET | ORAL | 1 refills | Status: DC

## 2017-12-31 MED ORDER — BENAZEPRIL HCL 40 MG PO TABS: 40.0000 mg | ORAL_TABLET | Freq: Every day | ORAL | 1 refills | Status: DC

## 2017-12-31 NOTE — Assessment & Plan Note (Signed)
Chronic Controlled Continue benazrpril 40 mg daily.
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
## 2017-12-31 NOTE — Assessment & Plan Note (Signed)
Chronic Controlled. Continue atorvastatin 20 mg daily.

## 2018-01-01 NOTE — Addendum Note (Signed)
Addended by: Fredderick Severance on: 01/01/2018 09:25 AM   Modules accepted: Orders

## 2018-01-31 ENCOUNTER — Other Ambulatory Visit: Payer: Self-pay

## 2018-01-31 DIAGNOSIS — N529 Male erectile dysfunction, unspecified: Secondary | ICD-10-CM

## 2018-01-31 MED ORDER — SILDENAFIL CITRATE 20 MG PO TABS: 20.0000 mg | ORAL_TABLET | Freq: Three times a day (TID) | ORAL | 11 refills | Status: DC

## 2018-02-04 ENCOUNTER — Other Ambulatory Visit: Payer: Self-pay

## 2018-02-04 MED ORDER — MELOXICAM 15 MG PO TABS: 15.0000 mg | ORAL_TABLET | Freq: Every day | ORAL | 0 refills | Status: DC

## 2018-02-07 ENCOUNTER — Ambulatory Visit: Payer: BLUE CROSS/BLUE SHIELD | Admitting: Urology

## 2018-03-14 ENCOUNTER — Encounter: Payer: Self-pay | Admitting: Urology

## 2018-03-14 ENCOUNTER — Ambulatory Visit (INDEPENDENT_AMBULATORY_CARE_PROVIDER_SITE_OTHER): Payer: BLUE CROSS/BLUE SHIELD | Admitting: Urology

## 2018-03-14 VITALS — BP 155/80 | HR 70 | Ht 70.0 in | Wt 268.0 lb

## 2018-03-14 DIAGNOSIS — Z3009 Encounter for other general counseling and advice on contraception: Secondary | ICD-10-CM | POA: Diagnosis not present
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
## 2018-05-13 ENCOUNTER — Ambulatory Visit (INDEPENDENT_AMBULATORY_CARE_PROVIDER_SITE_OTHER): Payer: BLUE CROSS/BLUE SHIELD | Admitting: Family Medicine

## 2018-05-13 ENCOUNTER — Encounter: Payer: Self-pay | Admitting: Family Medicine

## 2018-05-13 VITALS — BP 130/78 | HR 72 | Ht 70.0 in | Wt 288.0 lb

## 2018-05-13 DIAGNOSIS — Z6841 Body Mass Index (BMI) 40.0 and over, adult: Secondary | ICD-10-CM | POA: Diagnosis not present

## 2018-05-13 DIAGNOSIS — E782 Mixed hyperlipidemia: Secondary | ICD-10-CM | POA: Diagnosis not present

## 2018-05-13 DIAGNOSIS — R69 Illness, unspecified: Secondary | ICD-10-CM

## 2018-05-13 DIAGNOSIS — I10 Essential (primary) hypertension: Secondary | ICD-10-CM | POA: Diagnosis not present

## 2018-05-13 DIAGNOSIS — L409 Psoriasis, unspecified: Secondary | ICD-10-CM | POA: Diagnosis not present

## 2018-05-13 MED ORDER — ATORVASTATIN CALCIUM 20 MG PO TABS: ORAL_TABLET | ORAL | 1 refills | Status: DC

## 2018-05-13 MED ORDER — FISH OIL 1000 MG PO CAPS: 1000.0000 mg | ORAL_CAPSULE | Freq: Every day | ORAL | 3 refills | Status: DC

## 2018-05-13 MED ORDER — BENAZEPRIL HCL 40 MG PO TABS: 40.0000 mg | ORAL_TABLET | Freq: Every day | ORAL | 1 refills | Status: DC

## 2018-05-13 MED ORDER — TRIAMCINOLONE ACETONIDE 0.1 % EX CREA: 1.0000 "application " | TOPICAL_CREAM | Freq: Two times a day (BID) | CUTANEOUS | 0 refills | Status: DC
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
## 2018-05-13 NOTE — Patient Instructions (Signed)
Psoriasis    Psoriasis is a long-term (chronic) condition of skin inflammation. It occurs because your immune system causes skin cells to form too quickly. As a result, too many skin cells grow and create raised, red patches (plaques) that look silvery on your skin. Plaques may appear anywhere on your body. They can be any size or shape.  Psoriasis can come and go. The condition varies from mild to very severe. It cannot be passed from one person to another (not contagious).  What are the causes?  The cause of psoriasis is not known, but certain factors can make the condition worse. These include:   Damage or trauma to the skin, such as cuts, scrapes, sunburn, and dryness.   Lack of sunlight.   Certain medicines.   Alcohol.   Tobacco use.   Stress.   Infections caused by bacteria or viruses.  What increases the risk?  This condition is more likely to develop in:   People with a family history of psoriasis.   People who are Caucasian.   People who are between the ages of 15-30 and 50-60 years old.  What are the signs or symptoms?  There are five different types of psoriasis. You can have more than one type of psoriasis during your life. Types are:   Plaque.   Guttate.   Inverse.   Pustular.   Erythrodermic.  Each type of psoriasis has different symptoms.   Plaque psoriasis symptoms include red, raised plaques with a silvery white coating (scale). These plaques may be itchy. Your nails may be pitted and crumbly or fall off.   Guttate psoriasis symptoms include small red spots that often show up on your trunk, arms, and legs. These spots may develop after you have been sick, especially with strep throat.   Inverse psoriasis symptoms include plaques in your underarm area, under your breasts, or on your genitals, groin, or buttocks.   Pustular psoriasis symptoms include pus-filled bumps that are painful, red, and swollen on the palms of your hands or the soles of your feet. You also may feel  exhausted, feverish, weak, or have no appetite.   Erythrodermic psoriasis symptoms include bright red skin that may look burned. You may have a fast heartbeat and a body temperature that is too high or too low. You may be itchy or in pain.  How is this diagnosed?  Your health care provider may suspect psoriasis based on your symptoms and family history. Your health care provider will also do a physical exam. This may include a procedure to remove a tissue sample (biopsy) for testing. You may also be referred to a health care provider who specializes in skin diseases (dermatologist).  How is this treated?  There is no cure for this condition, but treatment can help manage it. Goals of treatment include:   Helping your skin heal.   Reducing itching and inflammation.   Slowing the growth of new skin cells.   Helping your immune system respond better to your skin.  Treatment varies, depending on the severity of your condition. Treatment may include:   Creams or ointments.   Ultraviolet ray exposure (light therapy). This may include natural sunlight or light therapy in a medical office.   Medicines (systemic therapy). These medicines can help your body better manage skin cell turnover and inflammation. They may be used along with light therapy or ointments. You may also get antibiotic medicines if you have an infection.  Follow these instructions at home:    Skin Care   Moisturize your skin as needed. Only use moisturizers that have been approved by your health care provider.   Apply cool compresses to the affected areas.   Do not scratch your skin.  Lifestyle     Do not use tobacco products. This includes cigarettes, chewing tobacco, and e-cigarettes. If you need help quitting, ask your health care provider.   Drink little or no alcohol.   Try techniques for stress reduction, such as meditation or yoga.   Get exposure to the sun as told by your health care provider. Do not get sunburned.   Consider joining  a psoriasis support group.  Medicines   Take or use over-the-counter and prescription medicines only as told by your health care provider.   If you were prescribed an antibiotic, take or use it as told by your health care provider. Do not stop taking the antibiotic even if your condition starts to improve.  General instructions   Keep a journal to help track what triggers an outbreak. Try to avoid any triggers.   See a counselor or social worker if feelings of sadness, frustration, and hopelessness about your condition are interfering with your work and relationships.   Keep all follow-up visits as told by your health care provider. This is important.  Contact a health care provider if:   Your pain gets worse.   You have increasing redness or warmth in the affected areas.   You have new or worsening pain or stiffness in your joints.   Your nails start to break easily or pull away from the nail bed.   You have a fever.   You feel depressed.  This information is not intended to replace advice given to you by your health care provider. Make sure you discuss any questions you have with your health care provider.  Document Released: 04/20/2000 Document Revised: 09/29/2015 Document Reviewed: 09/08/2014  Elsevier Interactive Patient Education  2019 [employer]

## 2018-05-21 LAB — RENAL FUNCTION PANEL
ALBUMIN: 4.2 g/dL (ref 3.6–4.8)
BUN/Creatinine Ratio: 18 (ref 10–24)
BUN: 16 mg/dL (ref 8–27)
CHLORIDE: 103 mmol/L (ref 96–106)
CO2: 22 mmol/L (ref 20–29)
Calcium: 9.3 mg/dL (ref 8.6–10.2)
Creatinine, Ser: 0.91 mg/dL (ref 0.76–1.27)
GFR calc non Af Amer: 91 mL/min/{1.73_m2} (ref >59)
GFR, EST AFRICAN AMERICAN: 106 mL/min/{1.73_m2} (ref >59)
GLUCOSE: 98 mg/dL (ref 65–99)
PHOSPHORUS: 3.1 mg/dL (ref 2.8–4.1)
POTASSIUM: 4.9 mmol/L (ref 3.5–5.2)
Sodium: 144 mmol/L (ref 134–144)

## 2018-05-21 LAB — LIPID PANEL
Chol/HDL Ratio: 4.2 ratio (ref 0.0–5.0)
Cholesterol, Total: 159 mg/dL (ref 100–199)
HDL: 38 mg/dL — ABNORMAL LOW (ref >39)
LDL Calculated: 83 mg/dL (ref 0–99)
Triglycerides: 189 mg/dL — ABNORMAL HIGH (ref 0–149)
VLDL CHOLESTEROL CAL: 38 mg/dL (ref 5–40)

## 2018-05-21 LAB — HEPATIC FUNCTION PANEL (6)
ALT: 22 IU/L (ref 0–44)
AST: 17 IU/L (ref 0–40)
Albumin: 4.1 g/dL (ref 3.6–4.8)
Alkaline Phosphatase: 71 IU/L (ref 39–117)
BILIRUBIN, DIRECT: 0.14 mg/dL (ref 0.00–0.40)
Bilirubin Total: 0.5 mg/dL (ref 0.0–1.2)

## 2018-07-22 ENCOUNTER — Encounter: Payer: Self-pay | Admitting: Family Medicine

## 2018-07-22 ENCOUNTER — Ambulatory Visit (INDEPENDENT_AMBULATORY_CARE_PROVIDER_SITE_OTHER): Payer: BLUE CROSS/BLUE SHIELD | Admitting: Family Medicine

## 2018-07-22 ENCOUNTER — Other Ambulatory Visit: Payer: Self-pay

## 2018-07-22 VITALS — BP 138/81 | HR 80 | Ht 70.0 in | Wt 285.0 lb

## 2018-07-22 DIAGNOSIS — Z9189 Other specified personal risk factors, not elsewhere classified: Secondary | ICD-10-CM
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
## 2018-07-31 ENCOUNTER — Other Ambulatory Visit: Payer: Self-pay

## 2018-07-31 DIAGNOSIS — E782 Mixed hyperlipidemia: Secondary | ICD-10-CM

## 2018-07-31 MED ORDER — ATORVASTATIN CALCIUM 20 MG PO TABS: ORAL_TABLET | ORAL | 1 refills | Status: DC

## 2018-12-30 ENCOUNTER — Encounter: Payer: Self-pay | Admitting: Family Medicine

## 2018-12-30 ENCOUNTER — Ambulatory Visit (INDEPENDENT_AMBULATORY_CARE_PROVIDER_SITE_OTHER): Payer: BLUE CROSS/BLUE SHIELD | Admitting: Family Medicine

## 2018-12-30 ENCOUNTER — Other Ambulatory Visit: Payer: Self-pay

## 2018-12-30 VITALS — BP 136/70 | HR 64 | Ht 70.0 in | Wt 288.0 lb

## 2018-12-30 DIAGNOSIS — I10 Essential (primary) hypertension: Secondary | ICD-10-CM

## 2018-12-30 DIAGNOSIS — S46312A Strain of muscle, fascia and tendon of triceps, left arm, initial encounter: Secondary | ICD-10-CM

## 2018-12-30 DIAGNOSIS — E782 Mixed hyperlipidemia: Secondary | ICD-10-CM

## 2018-12-30 DIAGNOSIS — S29011A Strain of muscle and tendon of front wall of thorax, initial encounter: Secondary | ICD-10-CM

## 2018-12-30 MED ORDER — ATORVASTATIN CALCIUM 20 MG PO TABS: ORAL_TABLET | ORAL | 1 refills | Status: DC

## 2018-12-30 MED ORDER — BENAZEPRIL HCL 40 MG PO TABS: 40.0000 mg | ORAL_TABLET | Freq: Every day | ORAL | 1 refills | Status: DC

## 2018-12-30 MED ORDER — FISH OIL 1000 MG PO CAPS: 1000.0000 mg | ORAL_CAPSULE | Freq: Every day | ORAL | 3 refills | Status: DC

## 2018-12-30 MED ORDER — MELOXICAM 15 MG PO TABS: 15.0000 mg | ORAL_TABLET | Freq: Every day | ORAL | 3 refills | Status: DC

## 2018-12-30 NOTE — Patient Instructions (Signed)
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
## 2018-12-31 LAB — LIPID PANEL WITH LDL/HDL RATIO
Cholesterol, Total: 182 mg/dL (ref 100–199)
HDL: 48 mg/dL (ref >39)
LDL Calculated: 92 mg/dL (ref 0–99)
LDl/HDL Ratio: 1.9 ratio (ref 0.0–3.6)
Triglycerides: 208 mg/dL — ABNORMAL HIGH (ref 0–149)
VLDL Cholesterol Cal: 42 mg/dL — ABNORMAL HIGH (ref 5–40)

## 2018-12-31 LAB — RENAL FUNCTION PANEL
Albumin: 4.6 g/dL (ref 3.8–4.8)
BUN/Creatinine Ratio: 25 — ABNORMAL HIGH (ref 10–24)
BUN: 24 mg/dL (ref 8–27)
CO2: 25 mmol/L (ref 20–29)
Calcium: 9.8 mg/dL (ref 8.6–10.2)
Chloride: 102 mmol/L (ref 96–106)
Creatinine, Ser: 0.95 mg/dL (ref 0.76–1.27)
GFR calc Af Amer: 99 mL/min/{1.73_m2} (ref >59)
GFR calc non Af Amer: 86 mL/min/{1.73_m2} (ref >59)
Glucose: 89 mg/dL (ref 65–99)
Phosphorus: 3.7 mg/dL (ref 2.8–4.1)
Potassium: 4.9 mmol/L (ref 3.5–5.2)
Sodium: 142 mmol/L (ref 134–144)

## 2019-03-26 DIAGNOSIS — R200 Anesthesia of skin: Secondary | ICD-10-CM | POA: Insufficient documentation

## 2019-03-26 DIAGNOSIS — M79602 Pain in left arm: Secondary | ICD-10-CM | POA: Insufficient documentation

## 2019-04-15 ENCOUNTER — Other Ambulatory Visit: Payer: Self-pay | Admitting: Family Medicine

## 2019-04-15 DIAGNOSIS — I10 Essential (primary) hypertension: Secondary | ICD-10-CM

## 2019-05-13 ENCOUNTER — Other Ambulatory Visit: Payer: Self-pay | Admitting: Family Medicine

## 2019-05-13 DIAGNOSIS — S29011A Strain of muscle and tendon of front wall of thorax, initial encounter: Secondary | ICD-10-CM

## 2019-05-13 DIAGNOSIS — E782 Mixed hyperlipidemia: Secondary | ICD-10-CM

## 2019-05-13 DIAGNOSIS — S46312A Strain of muscle, fascia and tendon of triceps, left arm, initial encounter: Secondary | ICD-10-CM

## 2019-06-16 ENCOUNTER — Other Ambulatory Visit: Payer: Self-pay

## 2019-06-16 ENCOUNTER — Encounter: Payer: Self-pay | Admitting: Family Medicine

## 2019-06-16 ENCOUNTER — Ambulatory Visit (INDEPENDENT_AMBULATORY_CARE_PROVIDER_SITE_OTHER): Payer: BC Managed Care – PPO | Admitting: Family Medicine

## 2019-06-16 VITALS — BP 130/70 | HR 80 | Ht 70.0 in | Wt 293.0 lb

## 2019-06-16 DIAGNOSIS — I10 Essential (primary) hypertension: Secondary | ICD-10-CM

## 2019-06-16 DIAGNOSIS — L409 Psoriasis, unspecified: Secondary | ICD-10-CM

## 2019-06-16 DIAGNOSIS — Z6841 Body Mass Index (BMI) 40.0 and over, adult: Secondary | ICD-10-CM | POA: Diagnosis not present

## 2019-06-16 DIAGNOSIS — E782 Mixed hyperlipidemia: Secondary | ICD-10-CM

## 2019-06-16 DIAGNOSIS — Z23 Encounter for immunization: Secondary | ICD-10-CM

## 2019-06-16 DIAGNOSIS — M25512 Pain in left shoulder: Secondary | ICD-10-CM

## 2019-06-16 DIAGNOSIS — G8929 Other chronic pain: Secondary | ICD-10-CM

## 2019-06-16 MED ORDER — FISH OIL 1000 MG PO CAPS: 1000.0000 mg | ORAL_CAPSULE | Freq: Every day | ORAL | 3 refills | Status: DC

## 2019-06-16 MED ORDER — MELOXICAM 15 MG PO TABS: 15.0000 mg | ORAL_TABLET | Freq: Every day | ORAL | 1 refills | Status: DC

## 2019-06-16 MED ORDER — ATORVASTATIN CALCIUM 20 MG PO TABS: 20.0000 mg | ORAL_TABLET | Freq: Every day | ORAL | 1 refills | Status: DC

## 2019-06-16 MED ORDER — BENAZEPRIL HCL 40 MG PO TABS: 40.0000 mg | ORAL_TABLET | Freq: Every day | ORAL | 1 refills | Status: DC
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
## 2019-06-16 NOTE — Patient Instructions (Signed)

## 2019-06-17 LAB — COMPREHENSIVE METABOLIC PANEL
ALT: 31 IU/L (ref 0–44)
AST: 21 IU/L (ref 0–40)
Albumin/Globulin Ratio: 1.7 (ref 1.2–2.2)
Albumin: 4.5 g/dL (ref 3.8–4.8)
Alkaline Phosphatase: 74 IU/L (ref 39–117)
BUN/Creatinine Ratio: 20 (ref 10–24)
BUN: 20 mg/dL (ref 8–27)
Bilirubin Total: 0.4 mg/dL (ref 0.0–1.2)
CO2: 22 mmol/L (ref 20–29)
Calcium: 10.2 mg/dL (ref 8.6–10.2)
Chloride: 104 mmol/L (ref 96–106)
Creatinine, Ser: 1.02 mg/dL (ref 0.76–1.27)
GFR calc Af Amer: 91 mL/min/{1.73_m2} (ref >59)
GFR calc non Af Amer: 79 mL/min/{1.73_m2} (ref >59)
Globulin, Total: 2.6 g/dL (ref 1.5–4.5)
Glucose: 101 mg/dL — ABNORMAL HIGH (ref 65–99)
Potassium: 5 mmol/L (ref 3.5–5.2)
Sodium: 143 mmol/L (ref 134–144)
Total Protein: 7.1 g/dL (ref 6.0–8.5)

## 2019-06-17 LAB — LIPID PANEL WITH LDL/HDL RATIO
Cholesterol, Total: 212 mg/dL — ABNORMAL HIGH (ref 100–199)
HDL: 44 mg/dL (ref >39)
LDL Chol Calc (NIH): 123 mg/dL — ABNORMAL HIGH (ref 0–99)
LDL/HDL Ratio: 2.8 ratio (ref 0.0–3.6)
Triglycerides: 256 mg/dL — ABNORMAL HIGH (ref 0–149)
VLDL Cholesterol Cal: 45 mg/dL — ABNORMAL HIGH (ref 5–40)

## 2019-06-17 LAB — RENAL FUNCTION PANEL: Phosphorus: 3.2 mg/dL (ref 2.8–4.1)

## 2019-07-17 ENCOUNTER — Telehealth: Payer: Self-pay

## 2019-07-17 ENCOUNTER — Other Ambulatory Visit: Payer: Self-pay

## 2019-07-17 DIAGNOSIS — J018 Other acute sinusitis: Secondary | ICD-10-CM

## 2019-07-17 MED ORDER — AZITHROMYCIN 250 MG PO TABS: ORAL_TABLET | ORAL | 0 refills | Status: DC

## 2019-07-17 NOTE — Telephone Encounter (Signed)
Pt called in c/o green production/ sinus issues. He was here on 2/19- sent in [name] this time, but will need to see if not better. Pt voiced understanding

## 2019-10-22 ENCOUNTER — Telehealth: Payer: Self-pay | Admitting: Family Medicine

## 2019-10-22 NOTE — Telephone Encounter (Signed)
Will see on Tuesday

## 2019-10-22 NOTE — Telephone Encounter (Signed)
Copied from [place] [id number]. Topic: General - Other >> Oct 22, 2019  8:42 AM Keene Breath wrote: Reason for CRM: Patient would like [name] to call him regarding his condition.  He didn't want to make an appt. But he had some questions for [name].  CB# [phone]

## 2019-10-27 ENCOUNTER — Ambulatory Visit
Admission: RE | Admit: 2019-10-27 | Discharge: 2019-10-27 | Disposition: A | Discharge status: Home / Self Care | Payer: BLUE CROSS/BLUE SHIELD | Source: Ambulatory Visit | Attending: Family Medicine | Admitting: Family Medicine

## 2019-10-27 ENCOUNTER — Encounter: Payer: Self-pay | Admitting: Family Medicine

## 2019-10-27 ENCOUNTER — Ambulatory Visit
Admission: RE | Admit: 2019-10-27 | Discharge: 2019-10-27 | Disposition: A | Discharge status: Home / Self Care | Payer: BLUE CROSS/BLUE SHIELD | Attending: Family Medicine | Admitting: Family Medicine

## 2019-10-27 ENCOUNTER — Ambulatory Visit (INDEPENDENT_AMBULATORY_CARE_PROVIDER_SITE_OTHER): Payer: BC Managed Care – PPO | Admitting: Family Medicine

## 2019-10-27 ENCOUNTER — Other Ambulatory Visit: Payer: Self-pay

## 2019-10-27 VITALS — BP 130/90 | HR 76 | Ht 70.0 in | Wt 290.0 lb

## 2019-10-27 DIAGNOSIS — M5116 Intervertebral disc disorders with radiculopathy, lumbar region: Secondary | ICD-10-CM | POA: Insufficient documentation

## 2019-10-27 MED ORDER — CYCLOBENZAPRINE HCL 10 MG PO TABS: 10.0000 mg | ORAL_TABLET | Freq: Three times a day (TID) | ORAL | 0 refills | Status: DC | PRN

## 2019-10-27 MED ORDER — MELOXICAM 15 MG PO TABS: 15.0000 mg | ORAL_TABLET | Freq: Every day | ORAL | 0 refills | Status: DC

## 2019-10-27 MED ORDER — PREDNISONE 10 MG PO TABS: 10.0000 mg | ORAL_TABLET | Freq: Every day | ORAL | 0 refills | Status: DC
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
## 2019-10-30 NOTE — Progress Notes (Signed)
Called pt told him to continue taking flexiril, meloxicam and prednisone. 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 Told pt to give [place] a call in if he doesn't feel better so we can put in a referral to ortho. Pt verbalized understanding.  KP

## 2019-11-04 ENCOUNTER — Telehealth: Payer: Self-pay | Admitting: Family Medicine

## 2019-11-04 NOTE — Telephone Encounter (Signed)
Spoke to pt and wife- gave number to [facility]'s office to be seen for hip pain

## 2019-11-04 NOTE — Telephone Encounter (Signed)
Copied from [place] [id number]. Topic: General - Other >> Nov 04, 2019  8:36 AM Alanda Slim E wrote: Reason for CRM: pt called to speak with [name] about his most recent visit/ please advise

## 2019-12-15 ENCOUNTER — Other Ambulatory Visit: Payer: Self-pay

## 2019-12-15 ENCOUNTER — Ambulatory Visit (INDEPENDENT_AMBULATORY_CARE_PROVIDER_SITE_OTHER): Payer: BC Managed Care – PPO | Admitting: Family Medicine

## 2019-12-15 ENCOUNTER — Encounter: Payer: Self-pay | Admitting: Family Medicine

## 2019-12-15 DIAGNOSIS — I10 Essential (primary) hypertension: Secondary | ICD-10-CM | POA: Diagnosis not present

## 2019-12-15 DIAGNOSIS — E782 Mixed hyperlipidemia: Secondary | ICD-10-CM

## 2019-12-15 MED ORDER — BENAZEPRIL HCL 40 MG PO TABS: 40.0000 mg | ORAL_TABLET | Freq: Every day | ORAL | 1 refills | Status: DC

## 2019-12-15 MED ORDER — ATORVASTATIN CALCIUM 20 MG PO TABS: 20.0000 mg | ORAL_TABLET | Freq: Every day | ORAL | 1 refills | Status: DC

## 2019-12-15 MED ORDER — FISH OIL 1000 MG PO CAPS: 1000.0000 mg | ORAL_CAPSULE | Freq: Every day | ORAL | 3 refills | Status: DC
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
## 2019-12-16 LAB — RENAL FUNCTION PANEL
Albumin: 4.6 g/dL (ref 3.8–4.8)
BUN/Creatinine Ratio: 18 (ref 10–24)
BUN: 18 mg/dL (ref 8–27)
CO2: 25 mmol/L (ref 20–29)
Calcium: 9.9 mg/dL (ref 8.6–10.2)
Chloride: 101 mmol/L (ref 96–106)
Creatinine, Ser: 0.99 mg/dL (ref 0.76–1.27)
GFR calc Af Amer: 94 mL/min/{1.73_m2} (ref >59)
GFR calc non Af Amer: 81 mL/min/{1.73_m2} (ref >59)
Glucose: 85 mg/dL (ref 65–99)
Phosphorus: 3.1 mg/dL (ref 2.8–4.1)
Potassium: 4.6 mmol/L (ref 3.5–5.2)
Sodium: 142 mmol/L (ref 134–144)

## 2019-12-16 LAB — LIPID PANEL WITH LDL/HDL RATIO
Cholesterol, Total: 209 mg/dL — ABNORMAL HIGH (ref 100–199)
HDL: 45 mg/dL (ref >39)
LDL Chol Calc (NIH): 126 mg/dL — ABNORMAL HIGH (ref 0–99)
LDL/HDL Ratio: 2.8 ratio (ref 0.0–3.6)
Triglycerides: 217 mg/dL — ABNORMAL HIGH (ref 0–149)
VLDL Cholesterol Cal: 38 mg/dL (ref 5–40)

## 2019-12-31 ENCOUNTER — Other Ambulatory Visit (HOSPITAL_COMMUNITY): Payer: Self-pay | Admitting: Physical Medicine & Rehabilitation

## 2019-12-31 ENCOUNTER — Other Ambulatory Visit: Payer: Self-pay | Admitting: Physical Medicine & Rehabilitation

## 2019-12-31 DIAGNOSIS — M5416 Radiculopathy, lumbar region: Secondary | ICD-10-CM

## 2019-12-31 DIAGNOSIS — M5441 Lumbago with sciatica, right side: Secondary | ICD-10-CM | POA: Insufficient documentation

## 2020-01-19 ENCOUNTER — Other Ambulatory Visit: Payer: Self-pay

## 2020-01-19 ENCOUNTER — Ambulatory Visit
Admission: RE | Admit: 2020-01-19 | Discharge: 2020-01-19 | Disposition: A | Discharge status: Home / Self Care | Payer: BC Managed Care – PPO | Source: Ambulatory Visit | Attending: Physical Medicine & Rehabilitation | Admitting: Physical Medicine & Rehabilitation

## 2020-01-19 DIAGNOSIS — M5416 Radiculopathy, lumbar region: Secondary | ICD-10-CM | POA: Insufficient documentation

## 2020-01-20 DIAGNOSIS — M48061 Spinal stenosis, lumbar region without neurogenic claudication: Secondary | ICD-10-CM | POA: Insufficient documentation

## 2020-05-26 ENCOUNTER — Telehealth: Payer: Self-pay

## 2020-05-26 NOTE — Telephone Encounter (Signed)
Moved med refill to Feb. Due to COVID

## 2020-05-26 NOTE — Telephone Encounter (Unsigned)
Copied from [place] [id number]. Topic: General - Other >> May 26, 2020  3:03 PM Ralph Giles wrote: Reason for CRM: Pt asked for a call from [place] no reason about what was needed/ please advise

## 2020-05-31 ENCOUNTER — Ambulatory Visit: Payer: BC Managed Care – PPO | Admitting: Family Medicine

## 2020-06-10 ENCOUNTER — Other Ambulatory Visit: Payer: Self-pay

## 2020-06-10 ENCOUNTER — Telehealth: Payer: Self-pay

## 2020-06-10 ENCOUNTER — Ambulatory Visit (INDEPENDENT_AMBULATORY_CARE_PROVIDER_SITE_OTHER): Payer: BC Managed Care – PPO | Admitting: Family Medicine

## 2020-06-10 ENCOUNTER — Encounter: Payer: Self-pay | Admitting: Family Medicine

## 2020-06-10 VITALS — BP 138/80 | HR 72 | Ht 70.0 in | Wt 284.0 lb

## 2020-06-10 DIAGNOSIS — Z23 Encounter for immunization: Secondary | ICD-10-CM | POA: Diagnosis not present

## 2020-06-10 DIAGNOSIS — E782 Mixed hyperlipidemia: Secondary | ICD-10-CM

## 2020-06-10 DIAGNOSIS — I10 Essential (primary) hypertension: Secondary | ICD-10-CM | POA: Diagnosis not present

## 2020-06-10 MED ORDER — ATORVASTATIN CALCIUM 20 MG PO TABS: 20.0000 mg | ORAL_TABLET | Freq: Every day | ORAL | 1 refills | Status: DC

## 2020-06-10 MED ORDER — BENAZEPRIL HCL 40 MG PO TABS: 40.0000 mg | ORAL_TABLET | Freq: Every day | ORAL | 1 refills | Status: DC

## 2020-06-10 MED ORDER — FISH OIL 1000 MG PO CAPS: 1000.0000 mg | ORAL_CAPSULE | Freq: Every day | ORAL | 3 refills | Status: AC
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
## 2020-06-10 NOTE — Telephone Encounter (Signed)
Copied from [place] [phone]. Topic: General - Other >> Jun 10, 2020 12:24 PM Ralph Giles D wrote: Pt need to speak with [name] about his CPAP / please advise

## 2020-06-10 NOTE — Telephone Encounter (Signed)
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

## 2020-06-10 NOTE — Patient Instructions (Signed)

## 2020-06-11 LAB — LIPID PANEL WITH LDL/HDL RATIO
Cholesterol, Total: 160 mg/dL (ref 100–199)
HDL: 40 mg/dL (ref >39)
LDL Chol Calc (NIH): 95 mg/dL (ref 0–99)
LDL/HDL Ratio: 2.4 ratio (ref 0.0–3.6)
Triglycerides: 141 mg/dL (ref 0–149)
VLDL Cholesterol Cal: 25 mg/dL (ref 5–40)

## 2020-06-11 LAB — COMPREHENSIVE METABOLIC PANEL
ALT: 27 IU/L (ref 0–44)
AST: 21 IU/L (ref 0–40)
Albumin/Globulin Ratio: 2.2 (ref 1.2–2.2)
Albumin: 4.2 g/dL (ref 3.8–4.8)
Alkaline Phosphatase: 80 IU/L (ref 44–121)
BUN/Creatinine Ratio: 20 (ref 10–24)
BUN: 19 mg/dL (ref 8–27)
Bilirubin Total: 0.8 mg/dL (ref 0.0–1.2)
CO2: 24 mmol/L (ref 20–29)
Calcium: 8 mg/dL — ABNORMAL LOW (ref 8.6–10.2)
Chloride: 104 mmol/L (ref 96–106)
Creatinine, Ser: 0.94 mg/dL (ref 0.76–1.27)
GFR calc Af Amer: 100 mL/min/{1.73_m2} (ref >59)
GFR calc non Af Amer: 87 mL/min/{1.73_m2} (ref >59)
Globulin, Total: 1.9 g/dL (ref 1.5–4.5)
Glucose: 93 mg/dL (ref 65–99)
Potassium: 4.6 mmol/L (ref 3.5–5.2)
Sodium: 143 mmol/L (ref 134–144)
Total Protein: 6.1 g/dL (ref 6.0–8.5)

## 2020-07-19 ENCOUNTER — Telehealth: Payer: Self-pay

## 2020-07-19 NOTE — Telephone Encounter (Signed)
Called and left message to continue chol med due to numbers finally being in normal limits

## 2020-07-19 NOTE — Telephone Encounter (Unsigned)
Copied from [place] [id number]. Topic: General - Other >> Jul 19, 2020  4:27 PM Pawlus, Brayton Layman A wrote: Reason for CRM: Pt had some questions regarding his cholesterol medicine, stated he knows his cholesterol has gotten better but asked if he could speak with [name] about it. Please call back.

## 2020-12-13 ENCOUNTER — Other Ambulatory Visit: Payer: Self-pay

## 2020-12-13 ENCOUNTER — Ambulatory Visit (INDEPENDENT_AMBULATORY_CARE_PROVIDER_SITE_OTHER): Payer: BC Managed Care – PPO | Admitting: Family Medicine

## 2020-12-13 ENCOUNTER — Encounter: Payer: Self-pay | Admitting: Family Medicine

## 2020-12-13 VITALS — BP 138/70 | HR 80 | Ht 70.0 in | Wt 295.0 lb

## 2020-12-13 DIAGNOSIS — I10 Essential (primary) hypertension: Secondary | ICD-10-CM

## 2020-12-13 DIAGNOSIS — E782 Mixed hyperlipidemia: Secondary | ICD-10-CM

## 2020-12-13 DIAGNOSIS — Z6841 Body Mass Index (BMI) 40.0 and over, adult: Secondary | ICD-10-CM | POA: Diagnosis not present

## 2020-12-13 MED ORDER — ATORVASTATIN CALCIUM 20 MG PO TABS: 20.0000 mg | ORAL_TABLET | Freq: Every day | ORAL | 1 refills | Status: DC

## 2020-12-13 MED ORDER — BENAZEPRIL HCL 40 MG PO TABS: 40.0000 mg | ORAL_TABLET | Freq: Every day | ORAL | 1 refills | Status: DC

## 2020-12-13 NOTE — Patient Instructions (Signed)
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
## 2021-02-05 IMAGING — MR MR LUMBAR SPINE W/O CM
5 series · 31 of 48 positions shown · non-contrast
Comparison: Lumbar radiographs 10/27/2019

CLINICAL DATA: Low back pain.  No prior back surgery.

EXAM:
MRI LUMBAR SPINE WITHOUT CONTRAST
TECHNIQUE: Multiplanar, multisequence MR imaging of the lumbar spine was
performed. No intravenous contrast was administered.

[Series 5: T2 · sagittal · 4.0mm · 0.81mm/px · 7 of 17 slices shown (1 of 2)]
[im 1/17]
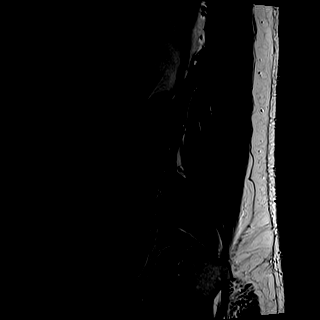
[im 3/17]
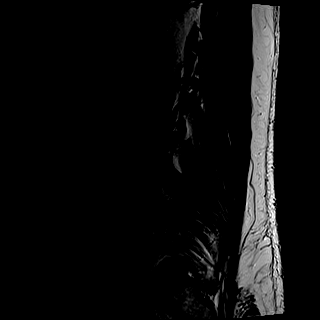
[im 6/17]
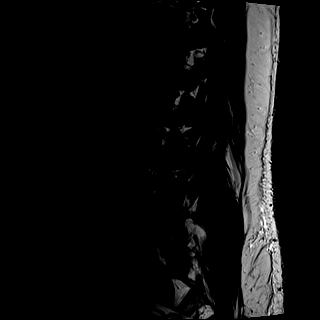
[im 9/17]
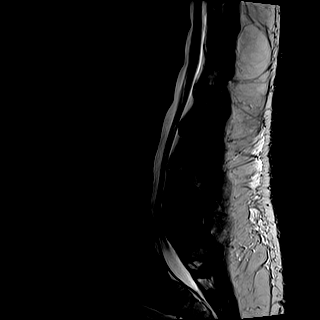
[im 11/17]
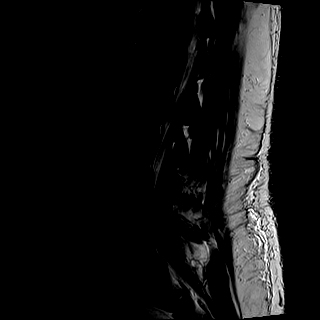
[im 14/17]
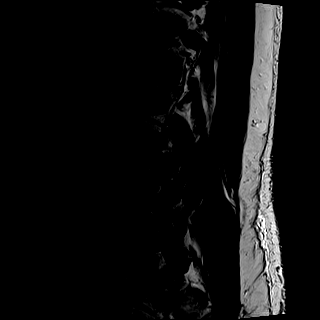
[im 17/17]
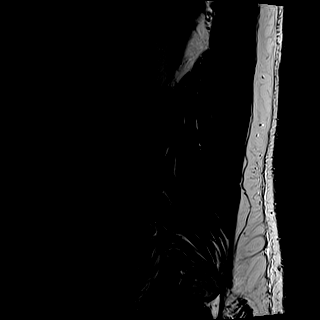

[Series 6: T1 · sagittal · 4.0mm · 0.81mm/px · 7 of 17 slices shown (1 of 2)]
[im 1/17]
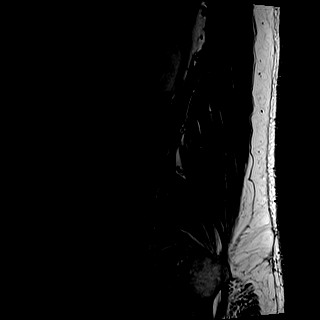
[im 3/17]
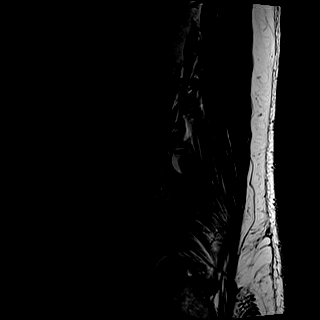
[im 6/17]
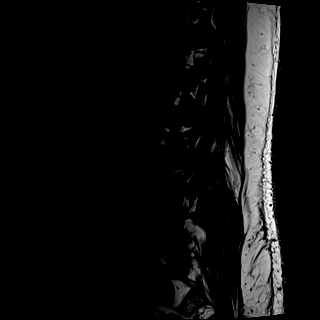
[im 9/17]
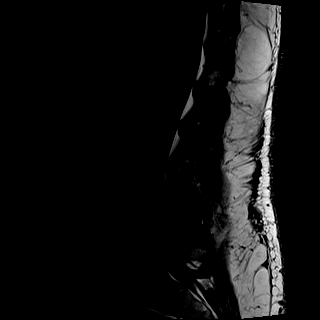
[im 11/17]
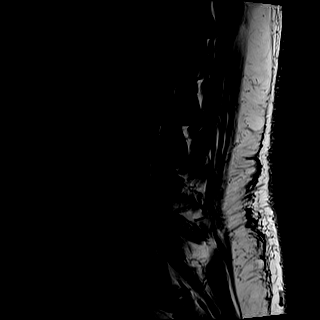
[im 14/17]
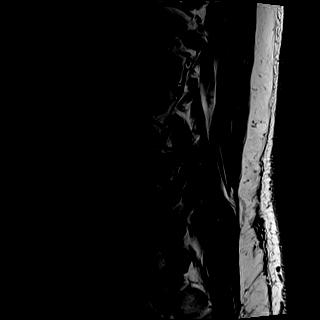
[im 17/17]
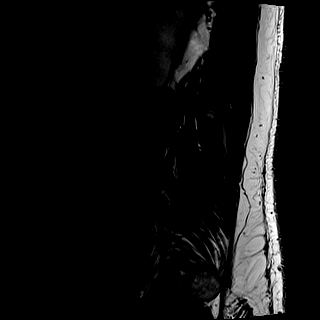

[Series 7: STIR · sagittal · 4.0mm · 0.41mm/px · 1 of 17 slices shown]
[im 1/17]
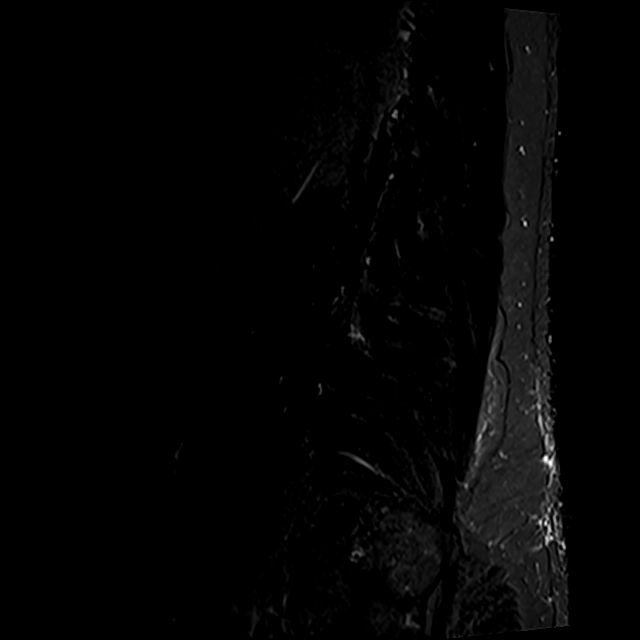

[Series 8: T2 · axial · 4.0mm · 0.78mm/px · z∈[-117,+95]mm · 8 of 38 slices shown (2 of 2)]
[im 1/38]
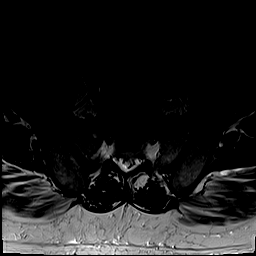
[im 6/38]
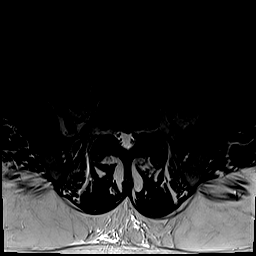
[im 12/38]
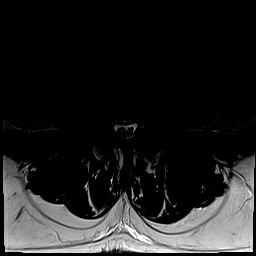
[im 18/38]
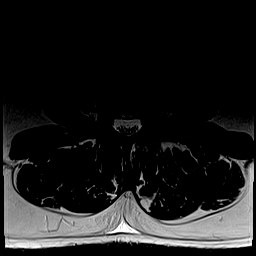
[im 20/38]
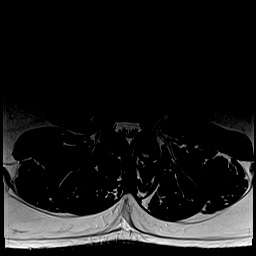
[im 26/38]
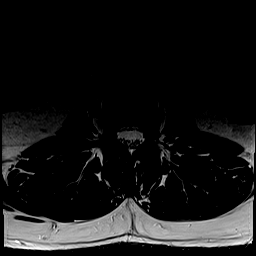
[im 32/38]
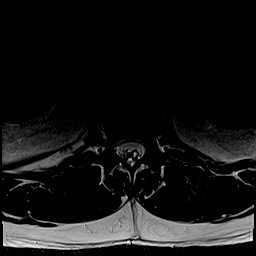
[im 38/38]
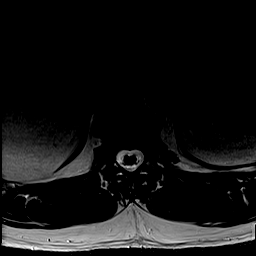

[Series 9: T1 · axial · 4.0mm · 0.39mm/px · z∈[-117,+95]mm · 8 of 38 slices shown (2 of 2)]
[im 1/38]
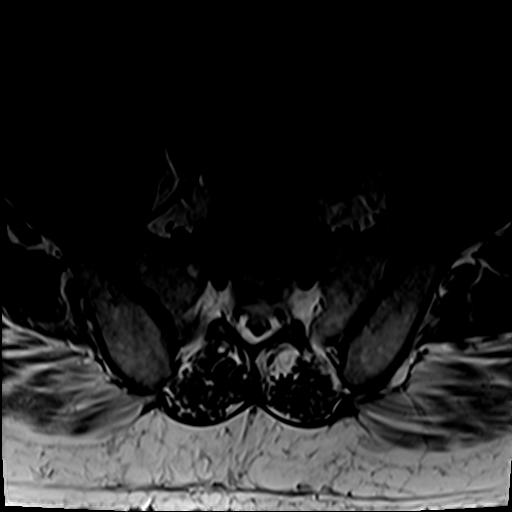
[im 6/38]
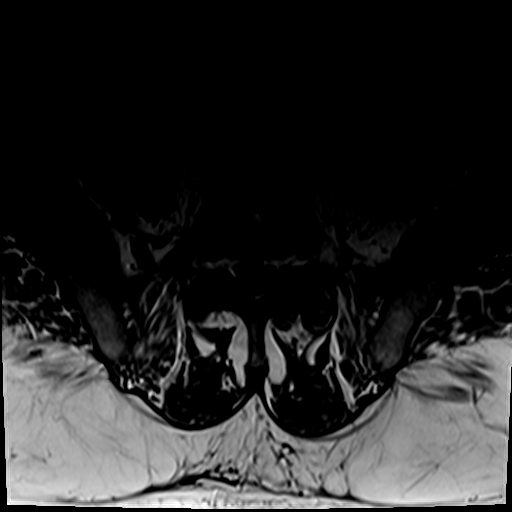
[im 12/38]
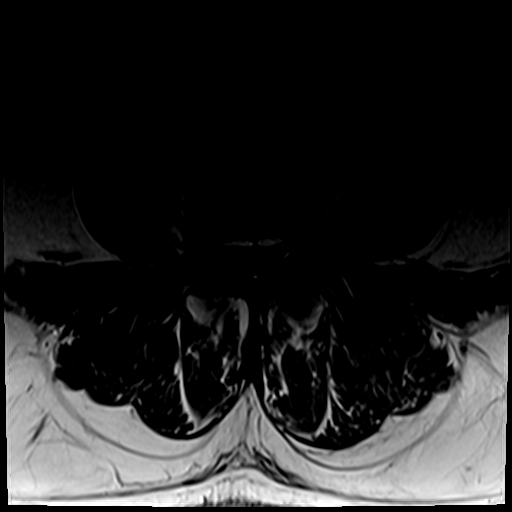
[im 18/38]
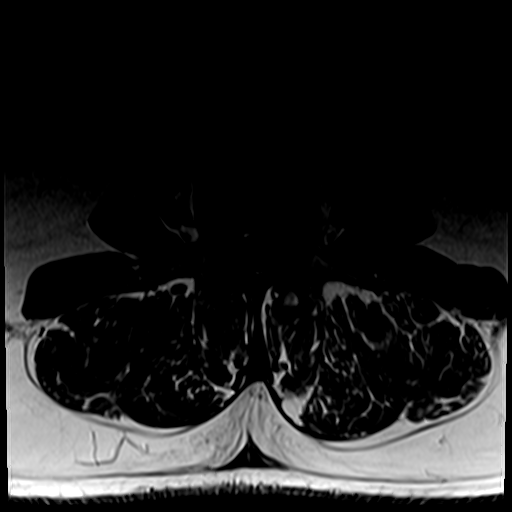
[im 20/38]
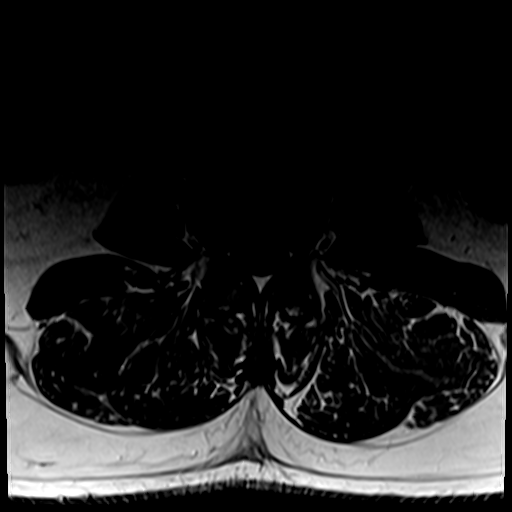
[im 26/38]
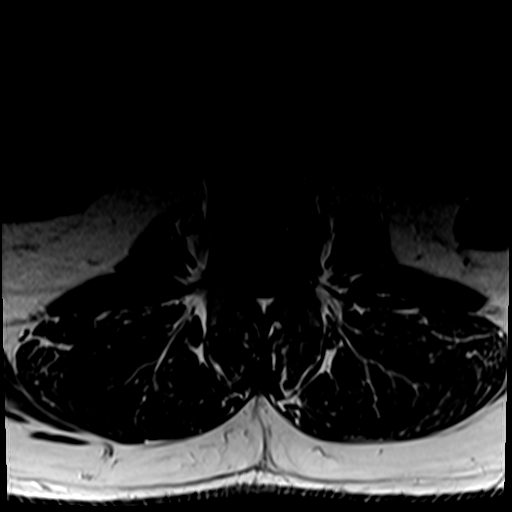
[im 32/38]
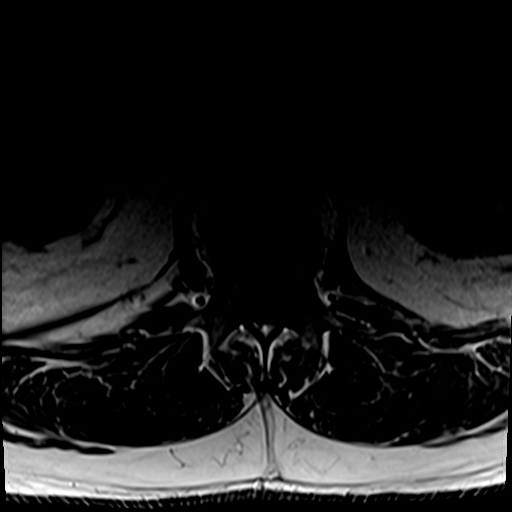
[im 38/38]
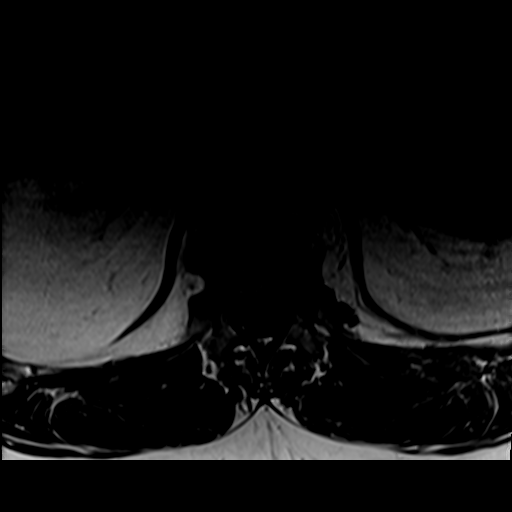

[31 of 48 positions shown; findings below may reference images not displayed]

FINDINGS: Segmentation:  Normal

Alignment:  Mild anterolisthesis L4-5

Vertebrae: Normal bone marrow. Negative for fracture or mass. Small
hemangioma L4 and L5 vertebral bodies. Mild bone marrow edema in the
pedicles at L4 and L5 likely due to facet degeneration.

Conus medullaris and cauda equina: Conus extends to the L1-2 level.
Conus and cauda equina appear normal.

Paraspinal and other soft tissues: Negative for paraspinous mass or
adenopathy.

Disc levels:

T12-L1: Mild disc degeneration.  Negative for stenosis

L1-2: Mild disc degeneration. Negative for disc protrusion or
stenosis

L2-3: Mild disc degeneration. Negative for disc protrusion or
stenosis

L3-4: Mild disc and mild facet degeneration. Small central disc
protrusion. Negative for stenosis.

L4-5: Mild anterolisthesis. Severe facet degeneration bilaterally.
Diffuse disc bulging. Moderate spinal stenosis. Moderate to severe
subarticular stenosis bilaterally. Small synovial cyst on the left
posterior to the left L4 nerve root and contributing to moderate
left foraminal stenosis

L5-S1: Negative
IMPRESSION: 1. Mild anterolisthesis L4-5 with moderate spinal stenosis and
moderate to severe subarticular stenosis bilaterally. 3 x 4 mm
synovial cyst on the left causing left foraminal encroachment and
left L4 nerve root impingement in the foramen.
2. Mild degenerative changes elsewhere in the lumbar spine as
described above.

## 2021-03-20 ENCOUNTER — Telehealth: Payer: Self-pay

## 2021-03-20 NOTE — Telephone Encounter (Signed)
Copied from [place] [phone]. Topic: Referral - Request for Referral >> Mar 20, 2021 11:27 AM Robina Ade, Helene Kelp D wrote: Has patient seen PCP for this complaint? Yes.   *If NO, is insurance requiring patient see PCP for this issue before PCP can refer them? Referral for which specialty: gastroenterology Preferred provider/office: [facility] Reason for referral: Gi issues

## 2021-05-10 ENCOUNTER — Telehealth: Payer: Self-pay

## 2021-05-10 NOTE — Telephone Encounter (Signed)
Copied from [place] [id number]. Topic: General - Other >> May 10, 2021  9:55 AM [place], [employer] R wrote: Reason for CRM: pt calling requesting a call back from [place] at [phone]

## 2021-05-15 ENCOUNTER — Other Ambulatory Visit: Payer: Self-pay

## 2021-05-15 DIAGNOSIS — R051 Acute cough: Secondary | ICD-10-CM

## 2021-05-15 MED ORDER — BENZONATATE 200 MG PO CAPS: 200.0000 mg | ORAL_CAPSULE | Freq: Two times a day (BID) | ORAL | 0 refills | Status: DC | PRN

## 2021-05-15 NOTE — Progress Notes (Signed)
Sent in [place] to [facility]

## 2021-06-14 ENCOUNTER — Other Ambulatory Visit: Payer: Self-pay

## 2021-06-14 ENCOUNTER — Encounter: Payer: Self-pay | Admitting: Family Medicine

## 2021-06-14 ENCOUNTER — Ambulatory Visit (INDEPENDENT_AMBULATORY_CARE_PROVIDER_SITE_OTHER): Payer: BC Managed Care – PPO | Admitting: Family Medicine

## 2021-06-14 VITALS — BP 138/80 | HR 68 | Ht 70.0 in | Wt 293.0 lb

## 2021-06-14 DIAGNOSIS — E782 Mixed hyperlipidemia: Secondary | ICD-10-CM

## 2021-06-14 DIAGNOSIS — Z6841 Body Mass Index (BMI) 40.0 and over, adult: Secondary | ICD-10-CM | POA: Diagnosis not present

## 2021-06-14 DIAGNOSIS — I10 Essential (primary) hypertension: Secondary | ICD-10-CM

## 2021-06-14 MED ORDER — BENAZEPRIL HCL 40 MG PO TABS: 40.0000 mg | ORAL_TABLET | Freq: Every day | ORAL | 1 refills | Status: DC

## 2021-06-14 MED ORDER — ATORVASTATIN CALCIUM 20 MG PO TABS: 20.0000 mg | ORAL_TABLET | Freq: Every day | ORAL | 1 refills | Status: DC

## 2021-06-14 NOTE — Patient Instructions (Signed)
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
## 2021-06-15 LAB — LIPID PANEL WITH LDL/HDL RATIO
Cholesterol, Total: 175 mg/dL (ref 100–199)
HDL: 39 mg/dL — ABNORMAL LOW (ref >39)
LDL Chol Calc (NIH): 112 mg/dL — ABNORMAL HIGH (ref 0–99)
LDL/HDL Ratio: 2.9 ratio (ref 0.0–3.6)
Triglycerides: 132 mg/dL (ref 0–149)
VLDL Cholesterol Cal: 24 mg/dL (ref 5–40)

## 2021-06-15 LAB — COMPREHENSIVE METABOLIC PANEL
ALT: 20 IU/L (ref 0–44)
AST: 15 IU/L (ref 0–40)
Albumin/Globulin Ratio: 1.7 (ref 1.2–2.2)
Albumin: 4.4 g/dL (ref 3.8–4.8)
Alkaline Phosphatase: 77 IU/L (ref 44–121)
BUN/Creatinine Ratio: 18 (ref 10–24)
BUN: 17 mg/dL (ref 8–27)
Bilirubin Total: 0.3 mg/dL (ref 0.0–1.2)
CO2: 24 mmol/L (ref 20–29)
Calcium: 9.6 mg/dL (ref 8.6–10.2)
Chloride: 107 mmol/L — ABNORMAL HIGH (ref 96–106)
Creatinine, Ser: 0.97 mg/dL (ref 0.76–1.27)
Globulin, Total: 2.6 g/dL (ref 1.5–4.5)
Glucose: 99 mg/dL (ref 70–99)
Potassium: 5.1 mmol/L (ref 3.5–5.2)
Sodium: 142 mmol/L (ref 134–144)
Total Protein: 7 g/dL (ref 6.0–8.5)
eGFR: 88 mL/min/{1.73_m2} (ref >59)

## 2021-10-09 ENCOUNTER — Telehealth: Payer: Self-pay

## 2021-10-09 ENCOUNTER — Telehealth: Payer: Self-pay | Admitting: Family Medicine

## 2021-10-09 NOTE — Telephone Encounter (Signed)
Spoke to pt- he is having some "leakage that is bloody" during the night. I scheduled for this Thursday to check a urine specimen and evaluate further

## 2021-10-09 NOTE — Telephone Encounter (Signed)
Copied from [place] [phone]. Topic: General - Call Back - No Documentation >> Oct 09, 2021 11:07 AM Scherrie Gerlach wrote: Reason for CRM: pt would like to speak w/ [name].  Declined to provide any other info.

## 2021-10-12 ENCOUNTER — Encounter: Payer: Self-pay | Admitting: Family Medicine

## 2021-10-12 ENCOUNTER — Ambulatory Visit (INDEPENDENT_AMBULATORY_CARE_PROVIDER_SITE_OTHER): Payer: BC Managed Care – PPO | Admitting: Family Medicine

## 2021-10-12 VITALS — BP 130/82 | HR 72 | Ht 70.0 in | Wt 282.0 lb

## 2021-10-12 DIAGNOSIS — N411 Chronic prostatitis: Secondary | ICD-10-CM

## 2021-10-12 DIAGNOSIS — R369 Urethral discharge, unspecified: Secondary | ICD-10-CM

## 2021-10-12 LAB — POCT URINALYSIS DIPSTICK
Bilirubin, UA: NEGATIVE
Glucose, UA: NEGATIVE
Ketones, UA: NEGATIVE
Nitrite, UA: NEGATIVE
Protein, UA: NEGATIVE
Spec Grav, UA: 1.02 (ref 1.010–1.025)
Urobilinogen, UA: 0.2 E.U./dL
pH, UA: 5 (ref 5.0–8.0)

## 2021-10-12 MED ORDER — SULFAMETHOXAZOLE-TRIMETHOPRIM 800-160 MG PO TABS: 1.0000 | ORAL_TABLET | Freq: Two times a day (BID) | ORAL | 0 refills | Status: AC
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
## 2021-11-01 ENCOUNTER — Ambulatory Visit (INDEPENDENT_AMBULATORY_CARE_PROVIDER_SITE_OTHER): Payer: BC Managed Care – PPO | Admitting: Family Medicine

## 2021-11-01 ENCOUNTER — Encounter: Payer: Self-pay | Admitting: Family Medicine

## 2021-11-01 VITALS — BP 138/78 | HR 64 | Ht 70.0 in | Wt 278.0 lb

## 2021-11-01 DIAGNOSIS — R319 Hematuria, unspecified: Secondary | ICD-10-CM | POA: Diagnosis not present

## 2021-11-01 LAB — POCT URINALYSIS DIPSTICK
Bilirubin, UA: NEGATIVE
Glucose, UA: NEGATIVE
Ketones, UA: NEGATIVE
Nitrite, UA: NEGATIVE
Protein, UA: NEGATIVE
Spec Grav, UA: 1.02 (ref 1.010–1.025)
Urobilinogen, UA: 0.2 E.U./dL
pH, UA: 6 (ref 5.0–8.0)
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
## 2021-11-15 ENCOUNTER — Encounter: Payer: Self-pay | Admitting: Urology

## 2021-11-15 ENCOUNTER — Other Ambulatory Visit
Admission: RE | Admit: 2021-11-15 | Discharge: 2021-11-15 | Disposition: A | Discharge status: Home / Self Care | Payer: BC Managed Care – PPO | Attending: Urology | Admitting: Urology

## 2021-11-15 ENCOUNTER — Ambulatory Visit (INDEPENDENT_AMBULATORY_CARE_PROVIDER_SITE_OTHER): Payer: BC Managed Care – PPO | Admitting: Urology

## 2021-11-15 ENCOUNTER — Other Ambulatory Visit: Payer: Self-pay | Admitting: *Deleted

## 2021-11-15 VITALS — BP 168/82 | HR 82 | Ht 69.0 in | Wt 278.0 lb

## 2021-11-15 DIAGNOSIS — N481 Balanitis: Secondary | ICD-10-CM

## 2021-11-15 DIAGNOSIS — R310 Gross hematuria: Secondary | ICD-10-CM

## 2021-11-15 DIAGNOSIS — R319 Hematuria, unspecified: Secondary | ICD-10-CM

## 2021-11-15 LAB — URINALYSIS, COMPLETE (UACMP) WITH MICROSCOPIC
Bilirubin Urine: NEGATIVE
Glucose, UA: NEGATIVE mg/dL
Ketones, ur: NEGATIVE mg/dL
Nitrite: NEGATIVE
Protein, ur: NEGATIVE mg/dL
Specific Gravity, Urine: 1.025 (ref 1.005–1.030)
pH: 5.5 (ref 5.0–8.0)

## 2021-11-15 MED ORDER — CLOTRIMAZOLE-BETAMETHASONE 1-0.05 % EX CREA: 1.0000 | TOPICAL_CREAM | Freq: Two times a day (BID) | CUTANEOUS | 0 refills | Status: DC

## 2021-11-15 MED ORDER — DOXYCYCLINE HYCLATE 100 MG PO CAPS: 100.0000 mg | ORAL_CAPSULE | Freq: Two times a day (BID) | ORAL | 0 refills | Status: DC
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
## 2021-11-15 NOTE — Patient Instructions (Signed)

## 2021-11-16 LAB — URINE CULTURE: Culture: NO GROWTH

## 2021-11-21 LAB — MISC LABCORP TEST (SEND OUT): Labcorp test code: 86884

## 2021-12-13 ENCOUNTER — Encounter: Payer: Self-pay | Admitting: Family Medicine

## 2021-12-13 ENCOUNTER — Ambulatory Visit (INDEPENDENT_AMBULATORY_CARE_PROVIDER_SITE_OTHER): Payer: BC Managed Care – PPO | Admitting: Family Medicine

## 2021-12-13 VITALS — BP 138/80 | HR 80 | Ht 69.0 in | Wt 279.0 lb

## 2021-12-13 DIAGNOSIS — I10 Essential (primary) hypertension: Secondary | ICD-10-CM

## 2021-12-13 DIAGNOSIS — E782 Mixed hyperlipidemia: Secondary | ICD-10-CM

## 2021-12-13 MED ORDER — ATORVASTATIN CALCIUM 20 MG PO TABS: 20.0000 mg | ORAL_TABLET | Freq: Every day | ORAL | 1 refills | Status: DC

## 2021-12-13 MED ORDER — BENAZEPRIL HCL 40 MG PO TABS
40.0000 mg | ORAL_TABLET | Freq: Every day | ORAL | 1 refills | Status: DC
Start: 2021-12-13 — End: 2022-06-15
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
## 2021-12-27 ENCOUNTER — Other Ambulatory Visit: Payer: BC Managed Care – PPO | Admitting: Urology
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
## 2022-01-25 ENCOUNTER — Ambulatory Visit (INDEPENDENT_AMBULATORY_CARE_PROVIDER_SITE_OTHER): Payer: BC Managed Care – PPO | Admitting: Orthopedic Surgery

## 2022-01-25 ENCOUNTER — Encounter: Payer: Self-pay | Admitting: Orthopedic Surgery

## 2022-01-25 VITALS — BP 148/84 | Ht 69.0 in | Wt 279.0 lb

## 2022-01-25 DIAGNOSIS — M4316 Spondylolisthesis, lumbar region: Secondary | ICD-10-CM | POA: Diagnosis not present

## 2022-01-25 DIAGNOSIS — M48062 Spinal stenosis, lumbar region with neurogenic claudication: Secondary | ICD-10-CM | POA: Diagnosis not present

## 2022-01-25 DIAGNOSIS — M5416 Radiculopathy, lumbar region: Secondary | ICD-10-CM | POA: Diagnosis not present

## 2022-01-25 NOTE — Patient Instructions (Signed)
It was so nice to see you today, I am sorry that you are hurting so much.   I sent physical therapy orders to [facility] clinic in [place], they should call you to schedule.   You can continue on over the counter aleve to help with pain and inflammation. You can take 1-2 twice a day as needed with food.  I want to get an MRI of your lower back to look into things further. We will call you to set this up at [facility].   Once I get the MRI results, we can do a phone visit to discuss and then we can get you in for possible injections.   If you have not heard back about any of the tests/procedures in the next week, please call the office so we can help you get these things scheduled.   Geronimo Boot PA-C [phone]

## 2022-02-12 ENCOUNTER — Ambulatory Visit
Admission: RE | Admit: 2022-02-12 | Discharge: 2022-02-12 | Disposition: A | Discharge status: Home / Self Care | Payer: BC Managed Care – PPO | Source: Ambulatory Visit | Attending: Orthopedic Surgery | Admitting: Orthopedic Surgery

## 2022-02-12 DIAGNOSIS — M4316 Spondylolisthesis, lumbar region: Secondary | ICD-10-CM

## 2022-02-12 DIAGNOSIS — M48062 Spinal stenosis, lumbar region with neurogenic claudication: Secondary | ICD-10-CM

## 2022-02-12 DIAGNOSIS — M5416 Radiculopathy, lumbar region: Secondary | ICD-10-CM

## 2022-03-26 ENCOUNTER — Ambulatory Visit
Admission: RE | Admit: 2022-03-26 | Discharge: 2022-03-26 | Disposition: A | Discharge status: Home / Self Care | Payer: BC Managed Care – PPO | Attending: Orthopedic Surgery | Admitting: Orthopedic Surgery

## 2022-03-26 ENCOUNTER — Ambulatory Visit
Admission: RE | Admit: 2022-03-26 | Discharge: 2022-03-26 | Disposition: A | Discharge status: Home / Self Care | Payer: BC Managed Care – PPO | Source: Ambulatory Visit | Attending: Orthopedic Surgery | Admitting: Orthopedic Surgery

## 2022-03-26 DIAGNOSIS — M48062 Spinal stenosis, lumbar region with neurogenic claudication: Secondary | ICD-10-CM | POA: Diagnosis present

## 2022-03-26 DIAGNOSIS — M5416 Radiculopathy, lumbar region: Secondary | ICD-10-CM | POA: Diagnosis present

## 2022-03-26 DIAGNOSIS — M4316 Spondylolisthesis, lumbar region: Secondary | ICD-10-CM | POA: Diagnosis present

## 2022-04-02 NOTE — Progress Notes (Unsigned)
Telephone Visit- Progress Note: Referring Physician:  [name], MD [address] [place],  [place] [address]  Primary Physician:  [name], MD  This visit was performed via telephone.  Patient location: home Provider location: office  I spent a total of 20 minutes non-face-to-face activities for this visit on the date of this encounter including review of current clinical condition and response to treatment.    Chief Complaint:  review of lumbar MRI/xrays  History of Present Illness: TAAHIR GRISBY is a 66 y.o. male who was last seen by me on 01/25/22 for bilateral leg pain (posterior and lateral) that is worse with standing and walking. He can only walk about 50 yards and then has to rest.    He has known lumbar stenosis with slip at L4-L5. Last MRI in 2021. He is working on weight loss on his own.  Telephone visit to review his lumbar MRI and xrays. He has been going to PT at [facility] and has done 6 visits.   He is not sure PT is helping. He has good days and bad days. Pain seems to get worse as the day progresses. He still has bilateral leg pain that is worse with standing and walking. No bowel or bladder issues.   He is working on weight loss on his own. Not sure how much he weighs now.    Conservative measures:  Physical therapy: currently in PT at [facility], has done 6 visits.  Multimodal medical therapy including regular antiinflammatories: neurontin, flexeril, norco, mobic  Injections:   -s/p bilateral L5-S1 TFESI 01/22/2020 with several days improvement but then pain returned -s/p bilateral L5-S1 TFESI 02/11/2020 with several days relief -s/p bilateral L5-S1 TFESI 03/03/2020 with several days relief   Past Surgery: No lumbar surgery    Exam: No exam done as this was a telephone encounter.     Imaging: Lumbar xrays dated 03/26/22:  FINDINGS: Grade 1 anterolisthesis at L4-5 is again noted. Listhesis measures 9 mm on the supine images. Increases  to 12 mm on the standing image with slight increased during flexion. Alignment is otherwise anatomic. Vertebral body heights are stable. Atherosclerotic changes are noted at the aorta without aneurysm.   IMPRESSION: 1. Grade 1 anterolisthesis at L4-5 with slight increase during flexion. 2. No acute abnormality. 3. Aortic atherosclerosis.     Electronically Signed   By: San Morelle M.D.   On: 03/28/2022 11:08  Lumbar MRI scan dated 02/12/22:  FINDINGS: Segmentation:  Standard.   Alignment:  Unchanged grade 1 anterolisthesis of L4 on L5.   Vertebrae: No fracture or suspicious marrow lesion. New mild far lateral right-sided superior endplate edema at L4 associated with a prominent osteophyte and favored to be degenerative. Decreased facet edema at L4-5.   Conus medullaris and cauda equina: Conus extends to the L1-2 level. Conus and cauda equina appear normal.   Paraspinal and other soft tissues: Unremarkable.   Disc levels:   T12-L1: Mild disc bulging without stenosis, unchanged.   L1-2: Mild disc bulging and mild facet hypertrophy without stenosis, unchanged.   L2-3: Disc bulging and moderate facet hypertrophy without stenosis, unchanged.   L3-4: Mild disc bulging and moderate facet hypertrophy without stenosis, unchanged.   L4-5: New mild disc space narrowing. Anterolisthesis with bulging uncovered disc and severe facet and ligamentum flavum hypertrophy result in progressive, severe spinal stenosis and moderate left greater than right neural foraminal stenosis. Small bilateral facet joint effusions. The small synovial cyst projecting anterior  to the left facet joint on the prior MRI is no longer present.   L5-S1: Normal disc. Mild-to-moderate facet hypertrophy without stenosis.   IMPRESSION: 1. Progressive, severe spinal stenosis and moderate neural foraminal stenosis at L4-5 related to severe facet arthrosis and anterolisthesis. 2. Unchanged disc and  facet degeneration elsewhere without stenosis.     Electronically Signed   By: Logan Bores M.D.   On: 02/12/2022 18:55    I have personally reviewed the images and agree with the above interpretation.  Assessment and Plan: Mr. Wenig is a pleasant 66 y.o. male with good days and bad days. Pain seems to get worse as the day progresses. He still has bilateral leg pain that is worse with standing and walking. No bowel or bladder issues.   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   Treatment options discussed with patient and following plan made:   - He continues to work on weight loss. Last measured BMI was 41. Would need to be below 35 prior to surgery (goal of 235 lbs).  - Continue with PT [facility] lumbar [facility] at [facility].  - He would like to revisit lumbar injections with Dr. Alba Destine in [facility] while he works on weight loss. Referral done.  - Follow up with me in clinic in 6 weeks for recheck visit and weight check.   Geronimo Boot PA-C Neurosurgery

## 2022-04-03 ENCOUNTER — Ambulatory Visit (INDEPENDENT_AMBULATORY_CARE_PROVIDER_SITE_OTHER): Payer: BC Managed Care – PPO | Admitting: Orthopedic Surgery

## 2022-04-03 ENCOUNTER — Encounter: Payer: Self-pay | Admitting: Orthopedic Surgery

## 2022-04-03 DIAGNOSIS — M4316 Spondylolisthesis, lumbar region: Secondary | ICD-10-CM

## 2022-04-03 DIAGNOSIS — M5416 Radiculopathy, lumbar region: Secondary | ICD-10-CM | POA: Diagnosis not present

## 2022-04-03 DIAGNOSIS — M48062 Spinal stenosis, lumbar region with neurogenic claudication: Secondary | ICD-10-CM
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
## 2022-05-18 ENCOUNTER — Ambulatory Visit: Payer: BC Managed Care – PPO | Admitting: Orthopedic Surgery

## 2022-05-28 ENCOUNTER — Encounter: Payer: Self-pay | Admitting: Orthopedic Surgery

## 2022-05-28 ENCOUNTER — Ambulatory Visit (INDEPENDENT_AMBULATORY_CARE_PROVIDER_SITE_OTHER): Payer: BC Managed Care – PPO | Admitting: Orthopedic Surgery

## 2022-05-28 VITALS — BP 148/88 | Ht 69.0 in | Wt 291.0 lb

## 2022-05-28 DIAGNOSIS — M5416 Radiculopathy, lumbar region: Secondary | ICD-10-CM

## 2022-05-28 DIAGNOSIS — M48062 Spinal stenosis, lumbar region with neurogenic claudication: Secondary | ICD-10-CM

## 2022-05-28 DIAGNOSIS — M4316 Spondylolisthesis, lumbar region: Secondary | ICD-10-CM
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
## 2022-06-15 ENCOUNTER — Ambulatory Visit (INDEPENDENT_AMBULATORY_CARE_PROVIDER_SITE_OTHER): Payer: BC Managed Care – PPO | Admitting: Family Medicine

## 2022-06-15 ENCOUNTER — Encounter: Payer: Self-pay | Admitting: Family Medicine

## 2022-06-15 VITALS — BP 128/78 | HR 80 | Ht 69.0 in | Wt 290.0 lb

## 2022-06-15 DIAGNOSIS — E782 Mixed hyperlipidemia: Secondary | ICD-10-CM

## 2022-06-15 DIAGNOSIS — I10 Essential (primary) hypertension: Secondary | ICD-10-CM

## 2022-06-15 MED ORDER — BENAZEPRIL HCL 40 MG PO TABS: 40.0000 mg | ORAL_TABLET | Freq: Every day | ORAL | 1 refills | Status: DC

## 2022-06-15 MED ORDER — ATORVASTATIN CALCIUM 20 MG PO TABS: 20.0000 mg | ORAL_TABLET | Freq: Every day | ORAL | 1 refills | Status: DC
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
## 2022-06-15 NOTE — Patient Instructions (Signed)
GUIDELINES FOR  LOW-CHOLESTEROL, LOW-TRIGLYCERIDE DIETS    FOODS TO USE   MEATS, FISH Choose lean meats (chicken, [place], veal, and non-fatty cuts of beef with excess fat trimmed; one serving = 3 oz of cooked meat). Also, fresh or frozen fish, canned fish packed in water, and shellfish (lobster, crabs, shrimp, and oysters). Limit use to no more than one serving of one of these per week. Shellfish are high in cholesterol but low in saturated fat and should be used sparingly. Meats and fish should be broiled (pan or oven) or baked on a rack.  EGGS Egg substitutes and egg whites (use freely). Egg yolks (limit two per week).  FRUITS Eat three servings of fresh fruit per day (1 serving =  cup). Be sure to have at least one citrus fruit daily. Frozen and canned fruit with no sugar or syrup added may be used.  VEGETABLES Most vegetables are not limited (see next page). One dark-green (string beans, escarole) or one deep yellow (squash) vegetable is recommended daily. Cauliflower, broccoli, and celery, as well as potato skins, are recommended for their fiber content. (Fiber is associated with cholesterol reduction) It is preferable to steam vegetables, but they may be boiled, strained, or braised with polyunsaturated vegetable oil (see below).  BEANS Dried peas or beans (1 serving =  cup) may be used as a bread substitute.  NUTS Almonds, walnuts, and peanuts may be used sparingly  (1 serving = 1 Tablespoonful). Use pumpkin, sesame, or sunflower seeds.  BREADS, GRAINS One roll or one slice of whole grain or enriched bread may be used, or three soda crackers or four pieces of melba toast as a substitute. Spaghetti, rice or noodles ( cup) or  large ear of corn may be used as a bread substitute. In preparing these foods do not use butter or shortening, use soft margarine. Also use egg and sugar substitutes.  Choose high fiber grains, such as oats and whole wheat.  CEREALS Use  cup of hot cereal or  cup of  cold cereal per day. Add a sugar substitute if desired, with 99% fat free or skim milk.  MILK PRODUCTS Always use 99% fat free or skim milk, dairy products such as low fat cheeses (farmer's uncreamed diet cottage), low-fat yogurt, and powdered skim milk.  FATS, OILS Use soft (not stick) margarine; vegetable oils that are high in polyunsaturated fats (such as safflower, sunflower, soybean, corn, and cottonseed). Always refrigerate meat drippings to harden the fat and remove it before preparing gravies  DESSERTS, SNACKS Limit to two servings per day; substitute each serving for a bread/cereal serving: ice milk, water sherbet (1/4 cup); unflavored gelatin or gelatin flavored with sugar substitute (1/3 cup); pudding prepared with skim milk (1/2 cup); egg white souffls; unbuttered popcorn (1  cups). Substitute carob for chocolate.  BEVERAGES Fresh fruit juices (limit 4 oz per day); black coffee, plain or herbal teas; soft drinks with sugar substitutes; club soda, preferably salt-free; cocoa made with skim milk or nonfat dried milk and water (sugar substitute added if desired); clear broth. Alcohol: limit two servings per day (see second page).  MISCELLANEOUS  You may use the following freely: vinegar, spices, herbs, nonfat bouillon, mustard, Worcestershire sauce, soy sauce, flavoring essence.                  GUIDELINES FOR  LOW-CHOLESTEROL, LOW TRIGLYCERIDE DIETS    FOODS TO AVOID   MEATS, FISH Marbled beef, pork, bacon, sausage, and other pork products; fatty  fowl (duck, goose); skin and fat of turkey and chicken; processed meats; luncheon meats (salami, bologna); frankfurters and fast-food hamburgers (theyre loaded with fat); organ meats (kidneys, liver); canned fish packed in oil.  °EGGS Limit egg yolks to two per week.   °FRUITS Coconuts (rich in saturated fats).  °VEGETABLES Avoid avocados. Starchy vegetables (potatoes, corn, lima beans, dried peas, beans) may be used only if  substitutes for a serving of bread or cereal. (Baked potato skin, however, is desirable for its fiber content.  °BEANS Commercial baked beans with sugar and/or pork added.  °NUTS Avoid nuts.  Limit peanuts and walnuts to one tablespoonful per day.  °BREADS, GRAINS Any baked goods with shortening and/or sugar. Commercial mixes with dried eggs and whole milk. Avoid sweet rolls, doughnuts, breakfast pastries (Danish), and sweetened packaged cereals (the added sugar converts readily to triglycerides).  °MILK PRODUCTS Whole milk and whole-milk packaged goods; cream; ice cream; whole-milk puddings, yogurt, or cheeses; nondairy cream substitutes.  °FATS, OILS Butter, lard, animal fats, bacon drippings, gravies, cream sauces as well as palm and coconut oils. All these are high in saturated fats. Examine labels on cholesterol free products for hydrogenated fats. (These are oils that have been hardened into solids and in the process have become saturated.)  °DESSERTS, SNACKS Fried snack foods like potato chips; chocolate; candies in general; jams, jellies, syrups; whole- milk puddings; ice cream and milk sherbets; hydrogenated peanut butter.  °BEVERAGES Sugared fruit juices and soft drinks; cocoa made with whole milk and/or sugar. When using alcohol (1 oz liquor, 5 oz beer, or 2 ½ oz dry table wine per serving), one serving must be substituted for one bread or cereal serving (limit, two servings of alcohol per day).  ° SPECIAL NOTES  °  Remember that even non-limited foods should be used in moderation. °While on a cholesterol-lowering diet, be sure to avoid animal fats and marbled meats. °3. While on a triglyceride-lowering diet, be sure to avoid sweets and to control the amount of carbohydrates you eat (starchy foods such as flour, bread, potatoes).While on a tri-glyceride-lowering diet, be sure to avoid sweets °Buy a good low-fat cookbook, such as the one published by the American Heart Association. °Consult your physician  if you have any questions.  ° ° ° ° ° ° ° ° ° ° ° ° ° °[facility] Low Glycemic Diet Plan ° ° °Low Glycemic Foods (20-49) Moderate Glycemic Foods (50-69) High Glycemic Foods (70-100)  °    °Breakfast Creals Breakfast Cereals Breakfast Cereals  °All Bran All-Bran Fruit'n Oats  ° Bran Buds Bran Chex  ° Cheerios Corn chex  °  °Fiber One Oatmeal (not instant)  ° Just Right Mini-Wheats  ° Corn Flakes Cream of Wheat  °  °Oat Bran Special K Swiss Muesli  ° Grape Nuts Grape Nut Flakes  °  °  Grits Nutri-Grain  °  °Fruits and fruit juice: Fruits Puffed Rice Puffed Wheat  °  °(Limit to 1-2 Servings per day) Banana (under-ride) Dates  ° Rice Chex Rice Krispies  °  °Apples Apricots (fresh/dried)  ° Figs Grapes  ° Shredded Wheat Team  °  °Blackberries Blueberries  ° Kiwi Mango  ° Total   °  °Cherries Cranberries  ° Oranges Raisins  °   °Peaches Pears  °  Fruits  °Plums Prunes  ° Fruit Juices Pineapple Watermelon  °  °Grapefruit Raspberries  ° Cranberry Juice Orange Juice  ° Banana (over-ripe)   °  °Strawberries Tangerines  °    °  Apple Juice Grapefruit Juice  ° Beans and Legumes Beverages  °Tomato Juice   ° [place]-type baked beans Sodas, sweet tea, pineapple juice  ° Canned pinto, kidney, or navy beans   °Beans and Legumes (fresh-cooked) Green peas Vegetables  °Black-eyed peas Butter Beans  °  Potato, baked, boiled, fried, mashed  °Chick peas Lentils  ° Vegetables French fries  °Green beans Lima beans  ° Beets Carrots  ° Canned or frozen corn  °Kidney beans Navy beans  ° Sweet potato Yam  ° Parsnips  °Pinto beans Snow peas  ° Corn on the cob Winter squash  °    °Non-starchy vegetables Grains Breads  °Asparagus, avocado, broccoli, cabbage Cornmeal Rice, brown  ° Most breads (white and whole grain)  °cauliflower, celery, cucumber, greens Rice, white Couscous  ° Bagels Bread sticks  °  °lettuce, mushrooms, peppers, tomatoes  Bread stuffing Kaiser roll  °  °okra, onions, spinach, summer squash Pasta Dinner rolls  ° Macaroni  Pizza, cheese  °   °Grains Ravioli, meat filled Spaghetti, white  ° Grains  °Barley Bulgur  °  Rice, instant Tapioca, with milk  °  °Rye Wild rice  ° Nuts   ° Cashews Macadamia  ° Candy and most cookies  °Nuts and oils    °Almonds, peanuts, sunflower seeds Snacks Snacks  °hazelnuts, pecans, walnuts Chocolate Ice cream, lowfat  ° Donuts Corn chips  °  °Oils that are liquid at room temperature Muffin Popcorn  ° Jelly beans Pretzels  °  °  Pastries  °Dairy, fish, meat, soy, and eggs    °Milk, skim Lowfat cheese  °  Restaurant and ethnic foods  °Yogurt, lowfat, fruit sugar sweetened  Most Chinese food (sugar in stir fry  °  or wok sauce)  °Lean red meat Fish  °  Teriyaki-style meats and vegetables  °Skinless chicken and turkey, shellfish    °    °Egg whites (up to 3 daily), Soy Products    °Egg yolks (up to 7 or _____ per week)    °  °

## 2022-06-16 LAB — LIPID PANEL WITH LDL/HDL RATIO
Cholesterol, Total: 190 mg/dL (ref 100–199)
HDL: 45 mg/dL (ref >39)
LDL Chol Calc (NIH): 125 mg/dL — ABNORMAL HIGH (ref 0–99)
LDL/HDL Ratio: 2.8 ratio (ref 0.0–3.6)
Triglycerides: 112 mg/dL (ref 0–149)
VLDL Cholesterol Cal: 20 mg/dL (ref 5–40)

## 2022-06-16 LAB — COMPREHENSIVE METABOLIC PANEL
ALT: 27 IU/L (ref 0–44)
AST: 20 IU/L (ref 0–40)
Albumin/Globulin Ratio: 1.9 (ref 1.2–2.2)
Albumin: 4.3 g/dL (ref 3.9–4.9)
Alkaline Phosphatase: 78 IU/L (ref 44–121)
BUN/Creatinine Ratio: 23 (ref 10–24)
BUN: 23 mg/dL (ref 8–27)
Bilirubin Total: 0.5 mg/dL (ref 0.0–1.2)
CO2: 24 mmol/L (ref 20–29)
Calcium: 9.5 mg/dL (ref 8.6–10.2)
Chloride: 102 mmol/L (ref 96–106)
Creatinine, Ser: 1.01 mg/dL (ref 0.76–1.27)
Globulin, Total: 2.3 g/dL (ref 1.5–4.5)
Glucose: 100 mg/dL — ABNORMAL HIGH (ref 70–99)
Potassium: 4.9 mmol/L (ref 3.5–5.2)
Sodium: 140 mmol/L (ref 134–144)
Total Protein: 6.6 g/dL (ref 6.0–8.5)
eGFR: 83 mL/min/{1.73_m2} (ref >59)

## 2022-06-18 ENCOUNTER — Other Ambulatory Visit: Payer: Self-pay

## 2022-06-18 DIAGNOSIS — E782 Mixed hyperlipidemia: Secondary | ICD-10-CM

## 2022-06-18 MED ORDER — ROSUVASTATIN CALCIUM 10 MG PO TABS: 10.0000 mg | ORAL_TABLET | Freq: Every day | ORAL | 0 refills | Status: DC

## 2022-06-18 NOTE — Progress Notes (Signed)
Sent in rosuvastatin
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
## 2022-07-12 ENCOUNTER — Ambulatory Visit: Payer: Medicare Other | Admitting: Neurosurgery

## 2022-07-12 ENCOUNTER — Encounter: Payer: Self-pay | Admitting: Neurosurgery

## 2022-07-12 VITALS — BP 130/80 | HR 73 | Ht 70.0 in | Wt 289.4 lb

## 2022-07-12 DIAGNOSIS — M4316 Spondylolisthesis, lumbar region: Secondary | ICD-10-CM

## 2022-07-12 DIAGNOSIS — M5441 Lumbago with sciatica, right side: Secondary | ICD-10-CM

## 2022-07-12 DIAGNOSIS — M5442 Lumbago with sciatica, left side: Secondary | ICD-10-CM | POA: Diagnosis not present

## 2022-07-12 DIAGNOSIS — G8929 Other chronic pain: Secondary | ICD-10-CM | POA: Diagnosis not present

## 2022-07-17 ENCOUNTER — Telehealth: Payer: Self-pay | Admitting: Family Medicine

## 2022-07-17 NOTE — Telephone Encounter (Signed)
Copied from [place] [phone]. Topic: General - Other >> Jul 17, 2022 11:36 AM [place] B wrote: Reason for CRM: Patient called in says had medicare also with BCBS for DOS 02/09 nad wants to know can the bill he received be sent thru medicare for payment. Please call back

## 2022-07-26 ENCOUNTER — Ambulatory Visit: Payer: BC Managed Care – PPO | Admitting: Neurosurgery

## 2022-08-14 ENCOUNTER — Other Ambulatory Visit: Payer: Self-pay

## 2022-08-14 DIAGNOSIS — I10 Essential (primary) hypertension: Secondary | ICD-10-CM

## 2022-08-14 DIAGNOSIS — E782 Mixed hyperlipidemia: Secondary | ICD-10-CM

## 2022-08-14 MED ORDER — ROSUVASTATIN CALCIUM 10 MG PO TABS: 10.0000 mg | ORAL_TABLET | Freq: Every day | ORAL | 1 refills | Status: DC

## 2022-08-14 MED ORDER — BENAZEPRIL HCL 40 MG PO TABS: 40.0000 mg | ORAL_TABLET | Freq: Every day | ORAL | 1 refills | Status: DC

## 2022-09-03 ENCOUNTER — Telehealth: Payer: Self-pay | Admitting: Neurosurgery

## 2022-09-03 NOTE — Telephone Encounter (Signed)
Patient has lost some weight and is down to 280. Dr.Yarbrough wanted him to reach 270. He is not able to lose any more because he is in too much pain to be active. Would you consider doing surgery on him at 280? He did lose weight because he stopped drinking beer.

## 2022-09-04 NOTE — Telephone Encounter (Signed)
Patient scheduled for 09/06/2022

## 2022-09-06 ENCOUNTER — Encounter: Payer: Self-pay | Admitting: Neurosurgery

## 2022-09-06 ENCOUNTER — Ambulatory Visit: Payer: Medicare Other | Admitting: Neurosurgery

## 2022-09-06 VITALS — BP 138/80 | HR 92 | Ht 70.0 in | Wt 282.0 lb

## 2022-09-06 DIAGNOSIS — G8929 Other chronic pain: Secondary | ICD-10-CM

## 2022-09-06 DIAGNOSIS — M5442 Lumbago with sciatica, left side: Secondary | ICD-10-CM | POA: Diagnosis not present

## 2022-09-06 DIAGNOSIS — M4316 Spondylolisthesis, lumbar region: Secondary | ICD-10-CM | POA: Diagnosis not present

## 2022-09-06 DIAGNOSIS — M5441 Lumbago with sciatica, right side: Secondary | ICD-10-CM

## 2022-09-06 NOTE — Patient Instructions (Signed)
Please see below for information in regards to your upcoming surgery:   Planned surgery: L4-5 lateral lumbar interbody fusion and posterior spinal fusion   Surgery date: 10/31/22 - you will find out your arrival time the business day before your surgery.   Pre-op appointment at [facility] Pre-admit Testing: we will call you with a date/time for this. Pre-admit testing is located on the first floor of the [facility], [address] [facility] [address], [address]. Please bring all prescriptions in the original prescription bottles to your appointment, even if you have reviewed medications by phone with a pharmacy representative. Pre-op labs may be done at your pre-op appointment. You are not required to fast for these labs. Should you need to change your pre-op appointment, please call Pre-admit testing at [phone].    Surgical clearance: we will send a clearance form to Dr Yetta Barre     Brace: [facility] will contact you regarding an appointment for the brace you will use after surgery. Their number is [phone] should you miss their call or have an issue with your brace after surgery. You will need to bring the brace to the hospital on the day of surgery.     NSAIDS (Non-steroidal anti-inflammatory drugs): because you are having a fusion, no NSAIDS (such as ibuprofen, aleve, naproxen, meloxicam, diclofenac) for 3 months after surgery. Celebrex is an exception. Tylenol is ok because it is not an NSAID.    Home health physical therapy: [facility] will contact you regarding home health physical therapy for after surgery.Their number is [phone].    Because you are having a fusion: for appointments after your 2 week follow-up: please arrive at the [facility] outpatient imaging center ([address], [place]) or [facility] one hour prior to your appointment for x-rays. This applies to every appointment  after your 2 week follow-up. Failure to do so may result in your appointment being rescheduled.   If you have FMLA/disability paperwork, please drop it off or fax it to [phone], attention [place].   We can be reached by phone or mychart 8am-4pm, Monday-Friday. If you have any questions/concerns before or after surgery, you can reach [place] at [phone], or you can send a mychart message. If you have a concern after hours that cannot wait until normal business hours, you can call [phone] and ask to page the neurosurgeon on call for [place].     Appointments/FMLA & disability paperwork: Patty & Cristin  Nurse: Royston Cowper  Medical assistants: Laurann Montana Physician Assistant's: Manning Charity & Drake Leach Surgeon: Venetia Night, MD
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
## 2022-09-16 ENCOUNTER — Other Ambulatory Visit: Payer: Self-pay | Admitting: Family Medicine

## 2022-09-16 DIAGNOSIS — E782 Mixed hyperlipidemia: Secondary | ICD-10-CM

## 2022-09-17 NOTE — Telephone Encounter (Signed)
Attempted to call patient to clarify pharmacy - local vs mail order. Left message reason for call- may call back.Will send short supply in case needed.

## 2022-09-28 ENCOUNTER — Other Ambulatory Visit: Payer: Self-pay

## 2022-09-28 DIAGNOSIS — Z01818 Encounter for other preprocedural examination: Secondary | ICD-10-CM

## 2022-10-08 ENCOUNTER — Ambulatory Visit (INDEPENDENT_AMBULATORY_CARE_PROVIDER_SITE_OTHER): Payer: Medicare Other | Admitting: Family Medicine

## 2022-10-08 ENCOUNTER — Encounter: Payer: Self-pay | Admitting: Family Medicine

## 2022-10-08 VITALS — BP 126/70 | HR 74 | Ht 70.0 in | Wt 281.0 lb

## 2022-10-08 DIAGNOSIS — I10 Essential (primary) hypertension: Secondary | ICD-10-CM | POA: Diagnosis not present

## 2022-10-08 DIAGNOSIS — Z01818 Encounter for other preprocedural examination: Secondary | ICD-10-CM
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
## 2022-10-18 ENCOUNTER — Encounter
Admission: RE | Admit: 2022-10-18 | Discharge: 2022-10-18 | Disposition: A | Discharge status: Home / Self Care | Payer: Medicare Other | Source: Ambulatory Visit | Attending: Neurosurgery | Admitting: Neurosurgery

## 2022-10-18 VITALS — BP 157/90 | HR 79 | Resp 14 | Ht 70.0 in | Wt 279.5 lb

## 2022-10-18 DIAGNOSIS — Z01818 Encounter for other preprocedural examination: Secondary | ICD-10-CM | POA: Diagnosis not present

## 2022-10-18 DIAGNOSIS — I10 Essential (primary) hypertension: Secondary | ICD-10-CM | POA: Insufficient documentation

## 2022-10-18 DIAGNOSIS — Z01812 Encounter for preprocedural laboratory examination: Secondary | ICD-10-CM

## 2022-10-18 DIAGNOSIS — G890 Central pain syndrome: Secondary | ICD-10-CM | POA: Diagnosis not present

## 2022-10-18 DIAGNOSIS — Z01810 Encounter for preprocedural cardiovascular examination: Secondary | ICD-10-CM

## 2022-10-18 DIAGNOSIS — M5442 Lumbago with sciatica, left side: Secondary | ICD-10-CM | POA: Diagnosis not present

## 2022-10-18 HISTORY — DX: Obesity, unspecified: E66.9

## 2022-10-18 HISTORY — DX: Metabolic syndrome: E88.810

## 2022-10-18 HISTORY — DX: Sleep apnea, unspecified: G47.30

## 2022-10-18 HISTORY — DX: Spinal stenosis, site unspecified: M48.00

## 2022-10-18 LAB — URINALYSIS, ROUTINE W REFLEX MICROSCOPIC
Bacteria, UA: NONE SEEN
Bilirubin Urine: NEGATIVE
Glucose, UA: NEGATIVE mg/dL
Hgb urine dipstick: NEGATIVE
Ketones, ur: NEGATIVE mg/dL
Leukocytes,Ua: NEGATIVE
Nitrite: NEGATIVE
Protein, ur: NEGATIVE mg/dL
Specific Gravity, Urine: 1.013 (ref 1.005–1.030)
pH: 6 (ref 5.0–8.0)

## 2022-10-18 LAB — TYPE AND SCREEN
ABO/RH(D): A POS
Antibody Screen: NEGATIVE

## 2022-10-18 LAB — CBC
HCT: 46 % (ref 39.0–52.0)
Hemoglobin: 15.5 g/dL (ref 13.0–17.0)
MCH: 32.3 pg (ref 26.0–34.0)
MCHC: 33.7 g/dL (ref 30.0–36.0)
MCV: 95.8 fL (ref 80.0–100.0)
Platelets: 228 10*3/uL (ref 150–400)
RBC: 4.8 MIL/uL (ref 4.22–5.81)
RDW: 12.3 % (ref 11.5–15.5)
WBC: 8.1 10*3/uL (ref 4.0–10.5)
nRBC: 0 % (ref 0.0–0.2)

## 2022-10-18 LAB — BASIC METABOLIC PANEL
Anion gap: 9 (ref 5–15)
BUN: 19 mg/dL (ref 8–23)
CO2: 25 mmol/L (ref 22–32)
Calcium: 9.3 mg/dL (ref 8.9–10.3)
Chloride: 106 mmol/L (ref 98–111)
Creatinine, Ser: 0.84 mg/dL (ref 0.61–1.24)
GFR, Estimated: >60 mL/min (ref >60)
Glucose, Bld: 96 mg/dL (ref 70–99)
Potassium: 4.1 mmol/L (ref 3.5–5.1)
Sodium: 140 mmol/L (ref 135–145)

## 2022-10-18 LAB — SURGICAL PCR SCREEN
MRSA, PCR: NEGATIVE
Staphylococcus aureus: NEGATIVE

## 2022-10-18 NOTE — Patient Instructions (Addendum)
Your procedure is scheduled on:10-31-22 Wednesday Report to the Registration Desk on the 1st floor of the [facility].Then proceed to the 2nd floor Surgery Desk To find out your arrival time, please call [phone] between 1PM - 3PM on:10-30-22 Tuesday If your arrival time is 6:00 am, do not arrive before that time as the [facility] entrance doors do not open until 6:00 am.  REMEMBER: Instructions that are not followed completely may result in serious medical risk, up to and including death; or upon the discretion of your surgeon and anesthesiologist your surgery may need to be rescheduled.  Do not eat food after midnight the night before surgery.  No gum chewing or hard candies.  You may however, drink CLEAR liquids up to 2 hours before you are scheduled to arrive for your surgery. Do not drink anything within 2 hours of your scheduled arrival time.  Clear liquids include: - water  - apple juice without pulp - gatorade (not RED colors) - black coffee or tea (Do NOT add milk or creamers to the coffee or tea) Do NOT drink anything that is not on this list.  You may continue Anti-inflammatories (NSAIDS) such as Advil, Aleve, Ibuprofen, Motrin, Naproxen, Naprosyn and Tylenol up until the day prior to surgery Stop ANY OVER THE COUNTER supplements/vitamins 7 days prior to surgery (Fish Oil)   Continue taking all prescribed medications   TAKE ONLY THESE MEDICATIONS THE MORNING OF SURGERY WITH A SIP OF WATER: -rosuvastatin (CRESTOR)   No Alcohol for 24 hours before or after surgery.  No Smoking including e-cigarettes for 24 hours before surgery.  No chewable tobacco products for at least 6 hours before surgery.  No nicotine patches on the day of surgery.  Do not use any "recreational" drugs for at least a week (preferably 2 weeks) before your surgery.  Please be advised that the combination of cocaine and anesthesia may have negative outcomes, up to and including death. If you test  positive for cocaine, your surgery will be cancelled.  On the morning of surgery brush your teeth with toothpaste and water, you may rinse your mouth with mouthwash if you wish. Do not swallow any toothpaste or mouthwash.  Use CHG Soap as directed on instruction sheet.  Do not wear jewelry, make-up, hairpins, clips or nail polish.  Do not wear lotions, powders, or perfumes.   Do not shave body hair from the neck down 48 hours before surgery.  Contact lenses, hearing aids and dentures may not be worn into surgery.  Do not bring valuables to the hospital. [facility] is not responsible for any missing/lost belongings or valuables.    Notify your doctor if there is any change in your medical condition (cold, fever, infection).  Wear comfortable clothing (specific to your surgery type) to the hospital.  After surgery, you can help prevent lung complications by doing breathing exercises.  Take deep breaths and cough every 1-2 hours. Your doctor may order a device called an Incentive Spirometer to help you take deep breaths. When coughing or sneezing, hold a pillow firmly against your incision with both hands. This is called "splinting." Doing this helps protect your incision. It also decreases belly discomfort.  If you are being admitted to the hospital overnight, leave your suitcase in the car. After surgery it may be brought to your room.  In case of increased patient census, it may be necessary for you, the patient, to continue your postoperative care in the Same Day Surgery department.  If you are being discharged the day of surgery, you will not be allowed to drive home. You will need a responsible individual to drive you home and stay with you for 24 hours after surgery.   If you are taking public transportation, you will need to have a responsible individual with you.  Please call the Pre-admissions Testing Dept. at [phone] if you have any questions about these  instructions.  Surgery Visitation Policy:  Patients having surgery or a procedure may have two visitors.  Children under the age of 76 must have an adult with them who is not the patient.  Inpatient Visitation:    Visiting hours are 7 a.m. to 8 p.m. Up to four visitors are allowed at one time in a patient room. The visitors may rotate out with other people during the day.  One visitor age 60 or older may stay with the patient overnight and must be in the room by 8 p.m.    Pre-operative 5 CHG Bath Instructions   You can play a key role in reducing the risk of infection after surgery. Your skin needs to be as free of germs as possible. You can reduce the number of germs on your skin by washing with CHG (chlorhexidine gluconate) soap before surgery. CHG is an antiseptic soap that kills germs and continues to kill germs even after washing.   DO NOT use if you have an allergy to chlorhexidine/CHG or antibacterial soaps. If your skin becomes reddened or irritated, stop using the CHG and notify one of our RNs at [phone].   Please shower with the CHG soap starting 4 days before surgery using the following schedule:     Please keep in mind the following:  DO NOT shave, including legs and underarms, starting the day of your first shower.   You may shave your face at any point before/day of surgery.  Place clean sheets on your bed the day you start using CHG soap. Use a clean washcloth (not used since being washed) for each shower. DO NOT sleep with pets once you start using the CHG.   CHG Shower Instructions:  If you choose to wash your hair and private area, wash first with your normal shampoo/soap.  After you use shampoo/soap, rinse your hair and body thoroughly to remove shampoo/soap residue.  Turn the water OFF and apply about 3 tablespoons (45 ml) of CHG soap to a CLEAN washcloth.  Apply CHG soap ONLY FROM YOUR NECK DOWN TO YOUR TOES (washing for 3-5 minutes)  DO NOT use CHG soap  on face, private areas, open wounds, or sores.  Pay special attention to the area where your surgery is being performed.  If you are having back surgery, having someone wash your back for you may be helpful. Wait 2 minutes after CHG soap is applied, then you may rinse off the CHG soap.  Pat dry with a clean towel  Put on clean clothes/pajamas   If you choose to wear lotion, please use ONLY the CHG-compatible lotions on the back of this paper.     Additional instructions for the day of surgery: DO NOT APPLY any lotions, deodorants, cologne, or perfumes.   Put on clean/comfortable clothes.  Brush your teeth.  Ask your nurse before applying any prescription medications to the skin.      CHG Compatible Lotions   Aveeno Moisturizing lotion  Cetaphil Moisturizing Cream  Cetaphil Moisturizing Lotion  Clairol Herbal Essence Moisturizing Lotion, Dry Skin  Clairol  Herbal Essence Moisturizing Lotion, Extra Dry Skin  Clairol Herbal Essence Moisturizing Lotion, Normal Skin  Curel Age Defying Therapeutic Moisturizing Lotion with Alpha Hydroxy  Curel Extreme Care Body Lotion  Curel Soothing Hands Moisturizing Hand Lotion  Curel Therapeutic Moisturizing Cream, Fragrance-Free  Curel Therapeutic Moisturizing Lotion, Fragrance-Free  Curel Therapeutic Moisturizing Lotion, Original Formula  Eucerin Daily Replenishing Lotion  Eucerin Dry Skin Therapy Plus Alpha Hydroxy Crme  Eucerin Dry Skin Therapy Plus Alpha Hydroxy Lotion  Eucerin Original Crme  Eucerin Original Lotion  Eucerin Plus Crme Eucerin Plus Lotion  Eucerin TriLipid Replenishing Lotion  Keri Anti-Bacterial Hand Lotion  Keri Deep Conditioning Original Lotion Dry Skin Formula Softly Scented  Keri Deep Conditioning Original Lotion, Fragrance Free Sensitive Skin Formula  Keri Lotion Fast Absorbing Fragrance Free Sensitive Skin Formula  Keri Lotion Fast Absorbing Softly Scented Dry Skin Formula  Keri Original Lotion  Keri Skin  Renewal Lotion Keri Silky Smooth Lotion  Keri Silky Smooth Sensitive Skin Lotion  Nivea Body Creamy Conditioning Oil  Nivea Body Extra Enriched Teacher, adult education Moisturizing Lotion Nivea Crme  Nivea Skin Firming Lotion  NutraDerm 30 Skin Lotion  NutraDerm Skin Lotion  NutraDerm Therapeutic Skin Cream  NutraDerm Therapeutic Skin Lotion  ProShield Protective Hand Cream  Provon moisturizing lotion

## 2022-10-24 ENCOUNTER — Other Ambulatory Visit: Payer: Self-pay | Admitting: Family Medicine

## 2022-10-24 DIAGNOSIS — I10 Essential (primary) hypertension: Secondary | ICD-10-CM

## 2022-10-24 NOTE — Telephone Encounter (Signed)
Requested Prescriptions  Pending Prescriptions Disp Refills   benazepril (LOTENSIN) 40 MG tablet [Pharmacy Med Name: Benazepril HCl 40 MG Oral Tablet] 100 tablet 2    Sig: TAKE 1 TABLET BY MOUTH DAILY     Cardiovascular:  ACE Inhibitors Failed - 10/24/2022  5:42 AM      Failed - Last BP in normal range    BP Readings from Last 1 Encounters:  10/18/22 (!) 157/90         Passed - Cr in normal range and within 180 days    Creatinine  Date Value Ref Range Status  08/10/2011 0.74 0.60 - 1.30 mg/dL Final   Creatinine, Ser  Date Value Ref Range Status  10/18/2022 0.84 0.61 - 1.24 mg/dL Final         Passed - K in normal range and within 180 days    Potassium  Date Value Ref Range Status  10/18/2022 4.1 3.5 - 5.1 mmol/L Final  08/10/2011 3.9 3.5 - 5.1 mmol/L Final         Passed - Patient is not pregnant      Passed - Valid encounter within last 6 months    Recent Outpatient Visits           2 weeks ago Essential hypertension   [facility]   4 months ago Essential hypertension   [place] [facility]   10 months ago Essential hypertension   [place] [facility]   11 months ago Hematuria, unspecified type   [facility]   1 year ago Bloody discharge from penis   [facility]       Future Appointments             In 1 month Duanne Limerick, MD [facility] at [facility], [facility]

## 2022-10-31 ENCOUNTER — Other Ambulatory Visit: Payer: Self-pay

## 2022-10-31 ENCOUNTER — Inpatient Hospital Stay
Admission: RE | Admit: 2022-10-31 | Discharge: 2022-11-01 | DRG: 455 | Disposition: A | Discharge status: Home Health | Payer: Medicare Other | Attending: Neurosurgery | Admitting: Neurosurgery

## 2022-10-31 ENCOUNTER — Encounter: Payer: Self-pay | Admitting: Neurosurgery

## 2022-10-31 ENCOUNTER — Inpatient Hospital Stay: Payer: Medicare Other | Admitting: Registered Nurse

## 2022-10-31 ENCOUNTER — Encounter
Admission: RE | Disposition: A | Discharge status: Home Health | Payer: Self-pay | Source: Home / Self Care | Attending: Neurosurgery

## 2022-10-31 ENCOUNTER — Inpatient Hospital Stay: Payer: Medicare Other

## 2022-10-31 ENCOUNTER — Inpatient Hospital Stay: Payer: Medicare Other | Admitting: Urgent Care

## 2022-10-31 DIAGNOSIS — M5442 Lumbago with sciatica, left side: Secondary | ICD-10-CM

## 2022-10-31 DIAGNOSIS — G4730 Sleep apnea, unspecified: Secondary | ICD-10-CM | POA: Diagnosis present

## 2022-10-31 DIAGNOSIS — M5441 Lumbago with sciatica, right side: Secondary | ICD-10-CM | POA: Diagnosis not present

## 2022-10-31 DIAGNOSIS — M48061 Spinal stenosis, lumbar region without neurogenic claudication: Secondary | ICD-10-CM | POA: Diagnosis not present

## 2022-10-31 DIAGNOSIS — Z981 Arthrodesis status: Secondary | ICD-10-CM | POA: Diagnosis not present

## 2022-10-31 DIAGNOSIS — Z87891 Personal history of nicotine dependence: Secondary | ICD-10-CM

## 2022-10-31 DIAGNOSIS — G8929 Other chronic pain: Secondary | ICD-10-CM | POA: Diagnosis not present

## 2022-10-31 DIAGNOSIS — E669 Obesity, unspecified: Secondary | ICD-10-CM | POA: Diagnosis present

## 2022-10-31 DIAGNOSIS — Z888 Allergy status to other drugs, medicaments and biological substances status: Secondary | ICD-10-CM

## 2022-10-31 DIAGNOSIS — Z01818 Encounter for other preprocedural examination: Secondary | ICD-10-CM

## 2022-10-31 DIAGNOSIS — J449 Chronic obstructive pulmonary disease, unspecified: Secondary | ICD-10-CM | POA: Diagnosis present

## 2022-10-31 DIAGNOSIS — Z6839 Body mass index (BMI) 39.0-39.9, adult: Secondary | ICD-10-CM

## 2022-10-31 DIAGNOSIS — M4326 Fusion of spine, lumbar region: Secondary | ICD-10-CM | POA: Diagnosis not present

## 2022-10-31 DIAGNOSIS — M1711 Unilateral primary osteoarthritis, right knee: Secondary | ICD-10-CM | POA: Diagnosis not present

## 2022-10-31 DIAGNOSIS — Z9081 Acquired absence of spleen: Secondary | ICD-10-CM | POA: Diagnosis not present

## 2022-10-31 DIAGNOSIS — I10 Essential (primary) hypertension: Secondary | ICD-10-CM | POA: Diagnosis not present

## 2022-10-31 DIAGNOSIS — E785 Hyperlipidemia, unspecified: Secondary | ICD-10-CM | POA: Diagnosis present

## 2022-10-31 DIAGNOSIS — M4726 Other spondylosis with radiculopathy, lumbar region: Secondary | ICD-10-CM | POA: Diagnosis present

## 2022-10-31 DIAGNOSIS — R1031 Right lower quadrant pain: Secondary | ICD-10-CM | POA: Diagnosis not present

## 2022-10-31 DIAGNOSIS — Z96642 Presence of left artificial hip joint: Secondary | ICD-10-CM | POA: Diagnosis present

## 2022-10-31 DIAGNOSIS — M4316 Spondylolisthesis, lumbar region: Secondary | ICD-10-CM | POA: Diagnosis not present

## 2022-10-31 HISTORY — PX: APPLICATION OF INTRAOPERATIVE CT SCAN: SHX6668

## 2022-10-31 HISTORY — PX: ANTERIOR LATERAL LUMBAR FUSION WITH PERCUTANEOUS SCREW 1 LEVEL: SHX5553

## 2022-10-31 LAB — ABO/RH: ABO/RH(D): A POS

## 2022-10-31 SURGERY — ANTERIOR LATERAL LUMBAR FUSION WITH PERCUTANEOUS SCREW 1 LEVEL
Anesthesia: General

## 2022-10-31 MED ORDER — DEXAMETHASONE SODIUM PHOSPHATE 10 MG/ML IJ SOLN
INTRAMUSCULAR | Status: DC | PRN
  Administered 2022-10-31: 10 mg via INTRAVENOUS

## 2022-10-31 MED ORDER — LACTATED RINGERS IV SOLN: INTRAVENOUS | Status: DC

## 2022-10-31 MED ORDER — FAMOTIDINE 20 MG PO TABS
ORAL_TABLET | ORAL | Status: AC
  Filled 2022-10-31: qty 1

## 2022-10-31 MED ORDER — FENTANYL CITRATE (PF) 100 MCG/2ML IJ SOLN
INTRAMUSCULAR | Status: DC | PRN
  Administered 2022-10-31 (×2): 50 ug via INTRAVENOUS

## 2022-10-31 MED ORDER — SURGIPHOR WOUND IRRIGATION SYSTEM - OPTIME
TOPICAL | Status: DC | PRN
  Administered 2022-10-31: 450 mL via TOPICAL

## 2022-10-31 MED ORDER — VANCOMYCIN HCL IN DEXTROSE 1-5 GM/200ML-% IV SOLN
INTRAVENOUS | Status: AC
  Filled 2022-10-31: qty 200

## 2022-10-31 MED ORDER — KETOROLAC TROMETHAMINE 15 MG/ML IJ SOLN
7.5000 mg | Freq: Four times a day (QID) | INTRAMUSCULAR | Status: AC
  Administered 2022-10-31 – 2022-11-01 (×3): 7.5 mg via INTRAVENOUS
  Filled 2022-10-31 (×3): qty 1

## 2022-10-31 MED ORDER — REMIFENTANIL HCL 1 MG IV SOLR
INTRAVENOUS | Status: AC
  Filled 2022-10-31: qty 1000

## 2022-10-31 MED ORDER — ACETAMINOPHEN 10 MG/ML IV SOLN
INTRAVENOUS | Status: AC
  Filled 2022-10-31: qty 100

## 2022-10-31 MED ORDER — CEFAZOLIN IN SODIUM CHLORIDE 3-0.9 GM/100ML-% IV SOLN: 3.0000 g | Freq: Once | INTRAVENOUS | Status: DC

## 2022-10-31 MED ORDER — SODIUM CHLORIDE (PF) 0.9 % IJ SOLN
INTRAMUSCULAR | Status: AC
  Filled 2022-10-31: qty 10

## 2022-10-31 MED ORDER — BISACODYL 10 MG RE SUPP: 10.0000 mg | Freq: Every day | RECTAL | Status: DC | PRN

## 2022-10-31 MED ORDER — BUPIVACAINE-EPINEPHRINE (PF) 0.5% -1:200000 IJ SOLN
INTRAMUSCULAR | Status: AC
  Filled 2022-10-31: qty 10

## 2022-10-31 MED ORDER — OXYCODONE HCL 5 MG/5ML PO SOLN: 5.0000 mg | Freq: Once | ORAL | Status: AC | PRN

## 2022-10-31 MED ORDER — BUPIVACAINE LIPOSOME 1.3 % IJ SUSP
INTRAMUSCULAR | Status: AC
  Filled 2022-10-31: qty 20

## 2022-10-31 MED ORDER — SODIUM CHLORIDE 0.9% FLUSH
3.0000 mL | Freq: Two times a day (BID) | INTRAVENOUS | Status: DC
  Administered 2022-10-31 – 2022-11-01 (×2): 3 mL via INTRAVENOUS

## 2022-10-31 MED ORDER — SUCCINYLCHOLINE CHLORIDE 200 MG/10ML IV SOSY
PREFILLED_SYRINGE | INTRAVENOUS | Status: AC
  Filled 2022-10-31: qty 10

## 2022-10-31 MED ORDER — POLYETHYLENE GLYCOL 3350 17 G PO PACK: 17.0000 g | PACK | Freq: Every day | ORAL | Status: DC | PRN

## 2022-10-31 MED ORDER — ONDANSETRON HCL 4 MG/2ML IJ SOLN
INTRAMUSCULAR | Status: DC | PRN
  Administered 2022-10-31: 4 mg via INTRAVENOUS

## 2022-10-31 MED ORDER — BUPIVACAINE HCL (PF) 0.5 % IJ SOLN
INTRAMUSCULAR | Status: AC
  Filled 2022-10-31: qty 30

## 2022-10-31 MED ORDER — EPHEDRINE SULFATE (PRESSORS) 50 MG/ML IJ SOLN
INTRAMUSCULAR | Status: DC | PRN
  Administered 2022-10-31 (×2): 10 mg via INTRAVENOUS

## 2022-10-31 MED ORDER — BENAZEPRIL HCL 20 MG PO TABS
40.0000 mg | ORAL_TABLET | ORAL | Status: DC
  Administered 2022-11-01: 40 mg via ORAL
  Filled 2022-10-31: qty 2

## 2022-10-31 MED ORDER — PHENOL 1.4 % MT LIQD: 1.0000 | OROMUCOSAL | Status: DC | PRN

## 2022-10-31 MED ORDER — REMIFENTANIL HCL 1 MG IV SOLR
INTRAVENOUS | Status: DC | PRN
  Administered 2022-10-31: .1 ug/kg/min via INTRAVENOUS

## 2022-10-31 MED ORDER — ONDANSETRON HCL 4 MG/2ML IJ SOLN
INTRAMUSCULAR | Status: AC
  Filled 2022-10-31: qty 2

## 2022-10-31 MED ORDER — CHLORHEXIDINE GLUCONATE 0.12 % MT SOLN
OROMUCOSAL | Status: AC
  Filled 2022-10-31: qty 15

## 2022-10-31 MED ORDER — SODIUM CHLORIDE FLUSH 0.9 % IV SOLN
INTRAVENOUS | Status: AC
  Filled 2022-10-31: qty 20

## 2022-10-31 MED ORDER — DEXAMETHASONE SODIUM PHOSPHATE 10 MG/ML IJ SOLN
INTRAMUSCULAR | Status: AC
  Filled 2022-10-31: qty 1

## 2022-10-31 MED ORDER — MORPHINE SULFATE (PF) 2 MG/ML IV SOLN
2.0000 mg | INTRAVENOUS | Status: AC | PRN
  Administered 2022-10-31: 2 mg via INTRAVENOUS
  Filled 2022-10-31: qty 1

## 2022-10-31 MED ORDER — OXYCODONE HCL 5 MG PO TABS: 5.0000 mg | ORAL_TABLET | ORAL | Status: DC | PRN

## 2022-10-31 MED ORDER — PROPOFOL 1000 MG/100ML IV EMUL
INTRAVENOUS | Status: AC
  Filled 2022-10-31: qty 100

## 2022-10-31 MED ORDER — PROPOFOL 10 MG/ML IV BOLUS
INTRAVENOUS | Status: DC | PRN
  Administered 2022-10-31: 100 ug/kg/min via INTRAVENOUS
  Administered 2022-10-31: 180 mg via INTRAVENOUS

## 2022-10-31 MED ORDER — ACETAMINOPHEN 10 MG/ML IV SOLN
INTRAVENOUS | Status: DC | PRN
  Administered 2022-10-31: 1000 mg via INTRAVENOUS

## 2022-10-31 MED ORDER — SODIUM CHLORIDE 0.9 % IV SOLN: INTRAVENOUS | Status: DC

## 2022-10-31 MED ORDER — ENOXAPARIN SODIUM 40 MG/0.4ML IJ SOSY
40.0000 mg | PREFILLED_SYRINGE | INTRAMUSCULAR | Status: DC
  Administered 2022-11-01: 40 mg via SUBCUTANEOUS
  Filled 2022-10-31: qty 0.4

## 2022-10-31 MED ORDER — MAGNESIUM CITRATE PO SOLN: 1.0000 | Freq: Once | ORAL | Status: DC | PRN

## 2022-10-31 MED ORDER — CEFAZOLIN SODIUM-DEXTROSE 2-4 GM/100ML-% IV SOLN
2.0000 g | Freq: Once | INTRAVENOUS | Status: AC
  Administered 2022-10-31: 1 g via INTRAVENOUS
  Administered 2022-10-31: 2 g via INTRAVENOUS

## 2022-10-31 MED ORDER — SUCCINYLCHOLINE CHLORIDE 200 MG/10ML IV SOSY
PREFILLED_SYRINGE | INTRAVENOUS | Status: DC | PRN
  Administered 2022-10-31: 120 mg via INTRAVENOUS

## 2022-10-31 MED ORDER — ROSUVASTATIN CALCIUM 10 MG PO TABS
10.0000 mg | ORAL_TABLET | ORAL | Status: DC
  Administered 2022-11-01: 10 mg via ORAL
  Filled 2022-10-31: qty 1

## 2022-10-31 MED ORDER — FENTANYL CITRATE (PF) 100 MCG/2ML IJ SOLN
INTRAMUSCULAR | Status: AC
  Filled 2022-10-31: qty 2

## 2022-10-31 MED ORDER — SENNA 8.6 MG PO TABS
1.0000 | ORAL_TABLET | Freq: Two times a day (BID) | ORAL | Status: DC
  Administered 2022-11-01: 8.6 mg via ORAL
  Filled 2022-10-31 (×2): qty 1

## 2022-10-31 MED ORDER — ACETAMINOPHEN 500 MG PO TABS
1000.0000 mg | ORAL_TABLET | Freq: Four times a day (QID) | ORAL | Status: DC
  Administered 2022-10-31 – 2022-11-01 (×4): 1000 mg via ORAL
  Filled 2022-10-31 (×4): qty 2

## 2022-10-31 MED ORDER — DOCUSATE SODIUM 100 MG PO CAPS
100.0000 mg | ORAL_CAPSULE | Freq: Two times a day (BID) | ORAL | Status: DC
  Administered 2022-11-01: 100 mg via ORAL
  Filled 2022-10-31 (×2): qty 1

## 2022-10-31 MED ORDER — ONDANSETRON HCL 4 MG/2ML IJ SOLN: 4.0000 mg | Freq: Four times a day (QID) | INTRAMUSCULAR | Status: DC | PRN

## 2022-10-31 MED ORDER — CEFAZOLIN SODIUM-DEXTROSE 2-4 GM/100ML-% IV SOLN
INTRAVENOUS | Status: AC
  Filled 2022-10-31: qty 100

## 2022-10-31 MED ORDER — OXYCODONE HCL 5 MG PO TABS
10.0000 mg | ORAL_TABLET | ORAL | Status: DC | PRN
  Administered 2022-10-31 – 2022-11-01 (×4): 10 mg via ORAL
  Filled 2022-10-31 (×4): qty 2

## 2022-10-31 MED ORDER — SODIUM CHLORIDE 0.9 % IV SOLN: INTRAVENOUS | Status: DC | PRN

## 2022-10-31 MED ORDER — PHENYLEPHRINE HCL-NACL 20-0.9 MG/250ML-% IV SOLN
INTRAVENOUS | Status: DC | PRN
  Administered 2022-10-31: 40 ug/min via INTRAVENOUS

## 2022-10-31 MED ORDER — SODIUM CHLORIDE 0.9% FLUSH: 3.0000 mL | INTRAVENOUS | Status: DC | PRN

## 2022-10-31 MED ORDER — ORAL CARE MOUTH RINSE: 15.0000 mL | Freq: Once | OROMUCOSAL | Status: AC

## 2022-10-31 MED ORDER — FENTANYL CITRATE (PF) 100 MCG/2ML IJ SOLN
25.0000 ug | INTRAMUSCULAR | Status: DC | PRN
  Administered 2022-10-31: 25 ug via INTRAVENOUS
  Administered 2022-10-31: 50 ug via INTRAVENOUS
  Administered 2022-10-31 (×3): 25 ug via INTRAVENOUS

## 2022-10-31 MED ORDER — OXYCODONE HCL 5 MG PO TABS
ORAL_TABLET | ORAL | Status: AC
  Filled 2022-10-31: qty 1

## 2022-10-31 MED ORDER — EPHEDRINE 5 MG/ML INJ
INTRAVENOUS | Status: AC
  Filled 2022-10-31: qty 5

## 2022-10-31 MED ORDER — VANCOMYCIN HCL IN DEXTROSE 1-5 GM/200ML-% IV SOLN
1000.0000 mg | Freq: Once | INTRAVENOUS | Status: AC
  Administered 2022-10-31: 1000 mg via INTRAVENOUS

## 2022-10-31 MED ORDER — SODIUM CHLORIDE 0.9 % IV SOLN: 250.0000 mL | INTRAVENOUS | Status: DC

## 2022-10-31 MED ORDER — SODIUM CHLORIDE (PF) 0.9 % IJ SOLN
INTRAMUSCULAR | Status: DC | PRN
  Administered 2022-10-31: 60 mL

## 2022-10-31 MED ORDER — CEFAZOLIN IN SODIUM CHLORIDE 2-0.9 GM/100ML-% IV SOLN: 3.0000 g | Freq: Once | INTRAVENOUS | Status: DC

## 2022-10-31 MED ORDER — MIDAZOLAM HCL 2 MG/2ML IJ SOLN
INTRAMUSCULAR | Status: DC | PRN
  Administered 2022-10-31: 2 mg via INTRAVENOUS

## 2022-10-31 MED ORDER — SURGIFLO WITH THROMBIN (HEMOSTATIC MATRIX KIT) OPTIME
TOPICAL | Status: DC | PRN
  Administered 2022-10-31: 1 via TOPICAL

## 2022-10-31 MED ORDER — CEFAZOLIN SODIUM-DEXTROSE 1-4 GM/50ML-% IV SOLN: 1.0000 g | Freq: Once | INTRAVENOUS | Status: DC

## 2022-10-31 MED ORDER — BUPIVACAINE-EPINEPHRINE 0.5% -1:200000 IJ SOLN
INTRAMUSCULAR | Status: DC | PRN
  Administered 2022-10-31: 6 mL
  Administered 2022-10-31: 10 mL

## 2022-10-31 MED ORDER — FAMOTIDINE 20 MG PO TABS
20.0000 mg | ORAL_TABLET | Freq: Once | ORAL | Status: AC
  Administered 2022-10-31: 20 mg via ORAL

## 2022-10-31 MED ORDER — MENTHOL 3 MG MT LOZG: 1.0000 | LOZENGE | OROMUCOSAL | Status: DC | PRN

## 2022-10-31 MED ORDER — ONDANSETRON HCL 4 MG PO TABS: 4.0000 mg | ORAL_TABLET | Freq: Four times a day (QID) | ORAL | Status: DC | PRN

## 2022-10-31 MED ORDER — METHOCARBAMOL 500 MG PO TABS
500.0000 mg | ORAL_TABLET | Freq: Four times a day (QID) | ORAL | Status: DC | PRN
  Administered 2022-10-31 – 2022-11-01 (×3): 500 mg via ORAL
  Filled 2022-10-31 (×3): qty 1

## 2022-10-31 MED ORDER — CHLORHEXIDINE GLUCONATE 0.12 % MT SOLN
15.0000 mL | Freq: Once | OROMUCOSAL | Status: AC
  Administered 2022-10-31: 15 mL via OROMUCOSAL

## 2022-10-31 MED ORDER — LIDOCAINE HCL (CARDIAC) PF 100 MG/5ML IV SOSY
PREFILLED_SYRINGE | INTRAVENOUS | Status: DC | PRN
  Administered 2022-10-31: 100 mg via INTRAVENOUS

## 2022-10-31 MED ORDER — CEFAZOLIN SODIUM-DEXTROSE 1-4 GM/50ML-% IV SOLN
INTRAVENOUS | Status: AC
  Filled 2022-10-31: qty 50

## 2022-10-31 MED ORDER — MIDAZOLAM HCL 2 MG/2ML IJ SOLN
INTRAMUSCULAR | Status: AC
  Filled 2022-10-31: qty 2

## 2022-10-31 MED ORDER — OXYCODONE HCL 5 MG PO TABS
5.0000 mg | ORAL_TABLET | Freq: Once | ORAL | Status: AC | PRN
  Administered 2022-10-31: 5 mg via ORAL

## 2022-10-31 MED ORDER — PROPOFOL 10 MG/ML IV BOLUS
INTRAVENOUS | Status: AC
  Filled 2022-10-31: qty 20

## 2022-10-31 MED ORDER — METHOCARBAMOL 1000 MG/10ML IJ SOLN: 500.0000 mg | Freq: Four times a day (QID) | INTRAVENOUS | Status: DC | PRN

## 2022-10-31 SURGICAL SUPPLY — 80 items
ADH SKN CLS APL DERMABOND .7 (GAUZE/BANDAGES/DRESSINGS) ×3
AGENT HMST KT MTR STRL THRMB (HEMOSTASIS) ×1
ALLOGRAFT BONE FIBER KORE 5 (Bone Implant) IMPLANT
APL PRP STRL LF DISP 70% ISPRP (MISCELLANEOUS) ×2
BASIN KIT SINGLE STR (MISCELLANEOUS) ×1 IMPLANT
CHLORAPREP W/TINT 26 (MISCELLANEOUS) ×2 IMPLANT
CORD BIP STRL DISP 12FT (MISCELLANEOUS) ×1 IMPLANT
CORD LIGHT LATERIAL X LIFT (MISCELLANEOUS) IMPLANT
COVER BACK TABLE REUSABLE LG (DRAPES) ×1 IMPLANT
COVERAGE SUPP BRAINLAB NG SPNE (MISCELLANEOUS) IMPLANT
COVERAGE SUPPORT SPINE BRAINLB (MISCELLANEOUS)
DERMABOND ADVANCED .7 DNX12 (GAUZE/BANDAGES/DRESSINGS) ×3 IMPLANT
DRAPE C ARM PK CFD 31 SPINE (DRAPES) ×1 IMPLANT
DRAPE C-ARMOR (DRAPES) IMPLANT
DRAPE INCISE IOBAN 66X45 STRL (DRAPES) ×1 IMPLANT
DRAPE LAPAROTOMY 100X77 ABD (DRAPES) ×2 IMPLANT
DRAPE POUCH INSTRU U-SHP 10X18 (DRAPES) ×1 IMPLANT
DRAPE SCAN PATIENT (DRAPES) ×1 IMPLANT
DRSG OPSITE POSTOP 4X6 (GAUZE/BANDAGES/DRESSINGS) IMPLANT
DRSG OPSITE POSTOP 4X8 (GAUZE/BANDAGES/DRESSINGS) IMPLANT
DRSG TEGADERM 2-3/8X2-3/4 SM (GAUZE/BANDAGES/DRESSINGS) IMPLANT
DRSG TEGADERM 4X4.75 (GAUZE/BANDAGES/DRESSINGS) IMPLANT
DRSG TEGADERM 6X8 (GAUZE/BANDAGES/DRESSINGS) IMPLANT
ELECT REM PT RETURN 9FT ADLT (ELECTROSURGICAL) ×2
ELECTRODE REM PT RTRN 9FT ADLT (ELECTROSURGICAL) ×2 IMPLANT
EX-PIN ORTHOLOCK NAV 4X150 (PIN) ×1 IMPLANT
FEE CVG SUPP BRAINLAB NG SPNE (MISCELLANEOUS) IMPLANT
FEE INTRAOP CADWELL SUPPLY NCS (MISCELLANEOUS) ×1 IMPLANT
FEE INTRAOP MONITOR IMPULS NCS (MISCELLANEOUS) ×1 IMPLANT
FORCEPS BPLR BAYO 10IN 1.0TIP (ORTHOPEDIC DISPOSABLE SUPPLIES) IMPLANT
GAUZE 4X4 16PLY ~~LOC~~+RFID DBL (SPONGE) IMPLANT
GAUZE SPONGE 2X2 STRL 8-PLY (GAUZE/BANDAGES/DRESSINGS) IMPLANT
GLOVE BIOGEL PI IND STRL 6.5 (GLOVE) ×3 IMPLANT
GLOVE SURG SYN 6.5 ES PF (GLOVE) ×5 IMPLANT
GLOVE SURG SYN 6.5 PF PI (GLOVE) ×5 IMPLANT
GLOVE SURG SYN 8.5 E (GLOVE) ×6 IMPLANT
GLOVE SURG SYN 8.5 PF PI (GLOVE) ×6 IMPLANT
GOWN SRG LRG LVL 4 IMPRV REINF (GOWNS) ×2 IMPLANT
GOWN SRG XL LVL 3 NONREINFORCE (GOWNS) ×2 IMPLANT
GOWN STRL NON-REIN TWL XL LVL3 (GOWNS) ×2
GOWN STRL REIN LRG LVL4 (GOWNS) ×2
GUIDEWIRE NITINOL BEVEL TIP (WIRE) IMPLANT
HOLDER FOLEY CATH W/STRAP (MISCELLANEOUS) ×1 IMPLANT
INTRAOP CADWELL SUPPLY FEE NCS (MISCELLANEOUS)
INTRAOP DISP SUPPLY FEE NCS (MISCELLANEOUS)
INTRAOP MONITOR FEE IMPULS NCS (MISCELLANEOUS)
KIT DILATOR XLIF 5 (KITS) IMPLANT
KIT DISP MARS 3V (KITS) IMPLANT
KIT SPINAL PRONEVIEW (KITS) ×1 IMPLANT
KIT TURNOVER KIT A (KITS) ×1 IMPLANT
KNIFE BAYONET SHORT DISCETOMY (MISCELLANEOUS) IMPLANT
MANIFOLD NEPTUNE II (INSTRUMENTS) ×2 IMPLANT
MARKER SKIN DUAL TIP RULER LAB (MISCELLANEOUS) ×2 IMPLANT
MARKER SPHERE PSV REFLC 13MM (MARKER) ×7 IMPLANT
MODULE NVM5 NEXT GEN EMG (NEUROSURGERY SUPPLIES) IMPLANT
NDL SAFETY ECLIP 18X1.5 (MISCELLANEOUS) ×1 IMPLANT
PACK LAMINECTOMY ARMC (PACKS) ×1 IMPLANT
PAD ARMBOARD 7.5X6 YLW CONV (MISCELLANEOUS) ×1 IMPLANT
PENCIL SMOKE EVACUATOR (MISCELLANEOUS) ×1 IMPLANT
ROD RELINE MAS LORD 5.5X45MM (Rod) IMPLANT
SCREW LOCK RELINE 5.5 TULIP (Screw) IMPLANT
SCREW RELINE RED 6.5X50MM POLY (Screw) IMPLANT
SOLUTION IRRIG SURGIPHOR (IV SOLUTION) ×1 IMPLANT
SPACER HEDRON 10D 18X50X13 (Spacer) IMPLANT
STAPLER SKIN PROX 35W (STAPLE) IMPLANT
SURGIFLO W/THROMBIN 8M KIT (HEMOSTASIS) ×1 IMPLANT
SUT DVC VLOC 3-0 CL 6 P-12 (SUTURE) ×1 IMPLANT
SUT ETHILON 3-0 FS-10 30 BLK (SUTURE)
SUT VIC AB 0 CT1 27 (SUTURE) ×3
SUT VIC AB 0 CT1 27XCR 8 STRN (SUTURE) ×1 IMPLANT
SUT VIC AB 2-0 CT1 18 (SUTURE) ×1 IMPLANT
SUTURE EHLN 3-0 FS-10 30 BLK (SUTURE) IMPLANT
SYR 30ML LL (SYRINGE) ×2 IMPLANT
TAPE CLOTH 3X10 WHT NS LF (GAUZE/BANDAGES/DRESSINGS) ×4 IMPLANT
TOWEL OR 17X26 4PK STRL BLUE (TOWEL DISPOSABLE) ×2 IMPLANT
TRAP FLUID SMOKE EVACUATOR (MISCELLANEOUS) ×1 IMPLANT
TRAY FOLEY SLVR 16FR LF STAT (SET/KITS/TRAYS/PACK) IMPLANT
TUBING CONNECTING 10 (TUBING) ×1 IMPLANT
WATER STERILE IRR 1000ML POUR (IV SOLUTION) ×2 IMPLANT
WATER STERILE IRR 500ML POUR (IV SOLUTION) IMPLANT

## 2022-10-31 NOTE — Op Note (Signed)
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  He failed conservative management prompting surgical intervention.  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 The patient understood these risks and agreed to proceed.  NAME OF ANTERIOR PROCEDURE:               1. Anterior lumbar interbody fusion via a right lateral retroperitoneal approach at L4/5 2. Placement of a Lordotic  Globus Hedron interbody cage, filled with Demineralized Bone Matrix  NAME OF POSTERIOR PROCEDURE 1. Posterior instrumentation using Nuvasive Reline Instrumentation 2. Posterolateral fusion, L4/5, utilizing demineralized bone matrix 3. Use of Stereotaxis   PROCEDURE:  Patient was brought to the operating room, intubated, turned to the lateral position.  All pressure points were checked and double-checked.  The patient was prepped and draped in the standard fashion. Prior to prepping, fluoroscopy was brought in and the patient was positioned with a large bump under the contralateral side between the iliac crest and rib cage, allowing the area between the iliac crest and the lateral aspect of the rib  cage to open and increase the ability to reach inferiorly, to facilitate entry into the disc space.  The incision was marked upon the skin both the location of the disc space as well as the superior most aspect of the iliac crest.  Based on the identification of the disc space an incision was prepared, marked upon the skin and eventually was used for our lateral incision.  The fluoroscopy was turned into a cross table A/P image in order to confirm that the patient's spine remained in a perpendicular trajectory to the floor without rotation.  Once confirming that all the pressure points were checked and double-checked and the patient remained in sturdy position strapped down in this slightly jack-knifed lateral position, the patient was prepped and draped in standard fashion.  The skin was injected with local anesthetic, then incised until the abdominal wall fascia was noted.  I bluntly dissected posteriorly until we were able to identify the posterior musculature near petit's triangle.  At this point, using primarily blunt dissection with our finger aided with a metzenbaum scissor, were able to enter the retroperitoneal cavity.  The retroperitoneal potential space was opened further until palpating out the psoas muscle, the medial aspect of the iliac crest, the medial aspect of the last rib and continued to define the retroperitoneal space with blunt dissection in order to facilitate safe placement of our dilators.    While protecting by dissecting directly onto a finger in the retroperitoneum, the retroperitoneal space was entered safely from the lateral incision and the initial dilator placed onto the muscle belly of the psoas.  While directly stimulating the dilator and after radiographically confirming our location relative to the disc space, I placed the dilator through the psoas.  The dilators were stimulated to ensure  remaining safely away from any of the lumbar plexus nerves; the dilators were  repositioned until no pathologic stimulation was appreciated.  Once I had confirmed the location of our initial dilator radiographically, a K-wire secured the dilator into the L4/5 disc space and confirmed position under A/P and lateral fluoroscopy.  At this point, I dilated up with direct stimulation to confirm lack of pathologic stimulation.  Once all the dilators were in position, I placed in the retractor and secured it onto the table, locked into position and confirmed under A/P and lateral fluoroscopy to confirm our approach angle to the disc space as well as location relative to the disc space.  I then placed the muscle stimulator in through the working channel down to the vertebral body, stimulating the entire lateral surface of the vertebral body and any of the visualized psoas muscle that was adjacent to the retractor, confirming again the safe passage to the psoas before we began performing the discectomy.  At this point, we began our discectomy at L4/5.  The disc was incised laterally throughout the extent of our exposure. Using a combination of pituitary rongeurs, Kerrison rongeurs, rasps, curettes of various sorts, we were able to begin to clean out the disc space.  Once we had cleaned out the majority of the disc space, we then cut the lateral annulus with a cob, breaking the lateral annual attachments on the contralateral side by subtly working the cob through the annulus while using flouroscopy.  Care was taken not to extend further than required after cutting the annular attachments.  After this had been performed, we prepared the endplates for placement of our graft, sized a graft to the disc space by serially dilating up in trial sizes until we confirmed that our graft would be well positioned, allowing distraction while maintaining good grip.  This was confirmed under A/P and lateral fluoroscopy in order to ensure its placement as an eventual trial for placement of our final graft.  We  irrigated with bacteriostatic saline.  Once confirmed placement, the Hedron implant filled with allograft was impacted into position at L4/5.   Through a combination of intradiscal distraction and anterior releasing, we were able to correct the anterior deformity during disc preparation and placement of the graft.  At this point, final radiographs were performed, and we began closure.  The wound was closed using 0 Vicryl interrupted suture in the fascia and 2-0 Vicryl inverted suture were placed in the subcutaneous tissue and dermis. 3-0 monocryl was used for final closure. Dermabond was used to close the skin.    After closing the anterior part in layers, the patient was repositioned into prone position.  All pressure points were checked and double-checked.  The posterior operative site was prepped and draped in standard fashion.  The stereotactic array was placed.  Stereotactic images were acquired using intraoperative CT scanning.  This was registered to the patient.  Using stereotaxis, screw trajectories were planned and incisions made.  The pedicles from L4 to L5 were cannulated bilaterally and K wires used to secure the tracks.  We then utilized a stereotactic screwdriver to place pedicle screws from L4 to L5.  At each level, Nuvasive Reline pedicle screws were placed.  Once the screws were placed, the screw extensions were then linked, a path was formed for the rod and a rod was utilized to connect the screws.  We then compressed, torqued / counter-torqued and removed the screw assembly. Once performed on each side, the C-arm  was brought back and to take confirmatory CT scan showing appropriate placement of all instrumentation and anatomic alignment.    Posterolateral arthrodesis was performed at L4-L5 utilizing demineralized bone matrix.  Again we confirmed radiographically and began our closure.  The wound was closed using 0 Vicryl interrupted suture in the fascia, 2-0 Vicryl inverted  suture were placed in the subcutaneous tissue and dermis. 3-0 monocryl was used for final closure. Dermabond was used to close the skin.    Needle, lap and all counts were correct at the end of the case.    There was no pathologic change in the neuromonitoring during the procedure.   Manning Charity PA assisted in the entire procedure. An assistant was required for this procedure due to the complexity.  The assistant provided assistance in tissue manipulation and suction, and was required for the successful and safe performance of the procedure. I performed the critical portions of the procedure.   Venetia Night MD Neurosurgery

## 2022-10-31 NOTE — Transfer of Care (Signed)
Immediate Anesthesia Transfer of Care Note  Patient: Ralph Giles  Procedure(s) Performed: L4-5 LATERAL LUMBAR INTERBODY FUSION AND POSTERIOR SPINAL FUSION APPLICATION OF INTRAOPERATIVE CT SCAN  Patient Location: PACU  Anesthesia Type:General  Level of Consciousness: awake and drowsy  Airway & Oxygen Therapy: Patient Spontanous Breathing and Patient connected to face mask oxygen  Post-op Assessment: Report given to RN and Post -op Vital signs reviewed and stable  Post vital signs: Reviewed and stable  Last Vitals:  Vitals Value Taken Time  BP 137/71 10/31/22 1308  Temp 36.7 C 10/31/22 1308  Pulse 74 10/31/22 1312  Resp 18 10/31/22 1312  SpO2 100 % 10/31/22 1312  Vitals shown include unvalidated device data.  Last Pain:  Vitals:   10/31/22 1308  TempSrc:   PainSc: Asleep         Complications: No notable events documented.

## 2022-10-31 NOTE — Anesthesia Procedure Notes (Addendum)
Procedure Name: Intubation Date/Time: 10/31/2022 10:36 AM  Performed by: Karoline Caldwell, CRNAPre-anesthesia Checklist: Patient identified, Patient being monitored, Timeout performed, Emergency Drugs available and Suction available Patient Re-evaluated:Patient Re-evaluated prior to induction Oxygen Delivery Method: Circle system utilized Preoxygenation: Pre-oxygenation with 100% oxygen Induction Type: IV induction Ventilation: Mask ventilation without difficulty Laryngoscope Size: McGraph and 4 Grade View: Grade I Tube type: Oral Tube size: 7.5 mm Number of attempts: 1 Airway Equipment and Method: Stylet Placement Confirmation: ETT inserted through vocal cords under direct vision, positive ETCO2 and breath sounds checked- equal and bilateral Secured at: 22 cm Tube secured with: Tape Dental Injury: Teeth and Oropharynx as per pre-operative assessment  Comments: Bottom lip blister noted before intubation, as per preop. Post extubation applied lip balm.
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
## 2022-10-31 NOTE — Plan of Care (Signed)
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
## 2022-10-31 NOTE — Anesthesia Postprocedure Evaluation (Signed)
Anesthesia Post Note  Patient: Ralph Giles  Procedure(s) Performed: L4-5 LATERAL LUMBAR INTERBODY FUSION AND POSTERIOR SPINAL FUSION APPLICATION OF INTRAOPERATIVE CT SCAN  Patient location during evaluation: PACU Anesthesia Type: General Level of consciousness: awake and alert Pain management: pain level controlled Vital Signs Assessment: post-procedure vital signs reviewed and stable Respiratory status: spontaneous breathing, nonlabored ventilation, respiratory function stable and patient connected to nasal cannula oxygen Cardiovascular status: blood pressure returned to baseline and stable Postop Assessment: no apparent nausea or vomiting Anesthetic complications: no  No notable events documented.   Last Vitals:  Vitals:   10/31/22 1345 10/31/22 1400  BP: (!) 145/82 (!) 148/84  Pulse: 66 75  Resp: 14 19  Temp: (!) 36.1 C (!) 36.3 C  SpO2: 92% 95%    Last Pain:  Vitals:   10/31/22 1400  TempSrc:   PainSc: Asleep                 Stephanie Coup

## 2022-11-01 ENCOUNTER — Encounter: Payer: Self-pay | Admitting: Neurosurgery

## 2022-11-01 MED ORDER — SENNA 8.6 MG PO TABS: 1.0000 | ORAL_TABLET | Freq: Two times a day (BID) | ORAL | 0 refills | Status: DC

## 2022-11-01 MED ORDER — METHYLPREDNISOLONE 4 MG PO TBPK: ORAL_TABLET | ORAL | 0 refills | Status: DC

## 2022-11-01 MED ORDER — METHOCARBAMOL 500 MG PO TABS: 500.0000 mg | ORAL_TABLET | Freq: Four times a day (QID) | ORAL | 0 refills | Status: DC | PRN

## 2022-11-01 MED ORDER — OXYCODONE HCL 5 MG PO TABS: 5.0000 mg | ORAL_TABLET | ORAL | 0 refills | Status: DC | PRN

## 2022-11-01 MED ORDER — BACLOFEN 10 MG PO TABS: 10.0000 mg | ORAL_TABLET | Freq: Three times a day (TID) | ORAL | 0 refills | Status: DC

## 2022-11-01 NOTE — Evaluation (Signed)
Physical Therapy Evaluation Patient Details Name: Ralph Giles MRN: [id number] DOB: [birth year] Today's Date: 11/01/2022  History of Present Illness  Amalio Spiller is a 67 y.o male admitted s/p lumbar fusion. pmh of sponylolithesis, chronic LBP, arthritis, dysmetabolic syndrome, LHD, HTN, obesity and sleep apnea.  Clinical Impression  Pt is a pleasant 67 year old male who was admitted s/p lumbar fusion. Pt performs bed mobility with graded asssitance, transfers mod I, and ambulation mod I with RW. Patient was able to verbalize precautions to SPT. Ambulate 246ft with RW mod I, pain increased to 7/10 on NPS, RPE 3/10. Navigated 4 steps with b/l UE support on both rails and step to pattern. Patient placed in chair with call bell and family in room. Pt demonstrates deficits with balance, mobility, ROM and pain.Pt will continue to receive skilled PT services while admitted and will defer to TOC/care team for updates regarding disposition planning. Communicated via secure chat with Susanne Borders, PA about bracing orders prior to session. Given permission to ambulate OOB w/out bracing and new orders were placed to supplement.       Recommendations for follow up therapy are one component of a multi-disciplinary discharge planning process, led by the attending physician.  Recommendations may be updated based on patient status, additional functional criteria and insurance authorization.  Follow Up Recommendations       Assistance Recommended at Discharge Intermittent Supervision/Assistance  Patient can return home with the following  A little help with walking and/or transfers;A little help with bathing/dressing/bathroom;Assistance with cooking/housework;Assist for transportation;Help with stairs or ramp for entrance    Equipment Recommendations Rolling walker (2 wheels)  Recommendations for Other Services       Functional Status Assessment Patient has had a recent decline in their  functional status and demonstrates the ability to make significant improvements in function in a reasonable and predictable amount of time.     Precautions / Restrictions Precautions Precautions: Back Precaution Booklet Issued: No Precaution Comments: No bending, lifting, twisting Restrictions Weight Bearing Restrictions: No      Mobility  Bed Mobility Overal bed mobility: Needs Assistance             General bed mobility comments: unable to assess during session as patient was sitting EOb upon arrival. Wife stated that her and a nurse helped him perform log rolling to come into sitting.    Transfers Overall transfer level: Needs assistance Equipment used: Rolling walker (2 wheels) Transfers: Sit to/from Stand Sit to Stand: Modified independent (Device/Increase time)           General transfer comment: increased time needed for transfers due to pain and RW use.    Ambulation/Gait Ambulation/Gait assistance: Modified independent (Device/Increase time) Gait Distance (Feet): 200 Feet Assistive device: Rolling walker (2 wheels) Gait Pattern/deviations: Step-through pattern       General Gait Details: increased supination with LLE.  Stairs Stairs: Yes Stairs assistance: Min guard Stair Management: Step to pattern, Two rails Number of Stairs: 4 General stair comments: Patient had b/l UE support reaching both rails and performed a step to patterm for ascending/descending.  Wheelchair Mobility    Modified Rankin (Stroke Patients Only)       Balance Overall balance assessment: Needs assistance Sitting-balance support: Bilateral upper extremity supported, Feet supported Sitting balance-Leahy Scale: Normal     Standing balance support: Bilateral upper extremity supported, During functional activity, Reliant on assistive device for balance Standing balance-Leahy Scale: Good  Pertinent Vitals/Pain Pain  Assessment Pain Assessment: 0-10 Pain Score: 6  Pain Location: back Pain Descriptors / Indicators: Dull, Discomfort, Tightness Pain Intervention(s): Limited activity within patient's tolerance, Monitored during session, Repositioned    Home Living Family/patient expects to be discharged to:: Private residence Living Arrangements: Spouse/significant other Available Help at Discharge: Family;Available 24 hours/day Type of Home: House Home Access: Stairs to enter Entrance Stairs-Rails: Can reach both Entrance Stairs-Number of Steps: 5   Home Layout: One level Home Equipment: Standard Walker;BSC/3in1;Shower seat - built in;Toilet riser      Prior Function Prior Level of Function : Independent/Modified Independent             Mobility Comments: IND, states he had pain waling greater than 5 feet but required no assistance. ADLs Comments: IND     Hand Dominance        Extremity/Trunk Assessment   Upper Extremity Assessment Upper Extremity Assessment: Overall WFL for tasks assessed    Lower Extremity Assessment Lower Extremity Assessment: Overall WFL for tasks assessed       Communication   Communication: No difficulties  Cognition Arousal/Alertness: Awake/alert Behavior During Therapy: WFL for tasks assessed/performed Overall Cognitive Status: Within Functional Limits for tasks assessed                                          General Comments      Exercises     Assessment/Plan    PT Assessment Patient needs continued PT services  PT Problem List Decreased range of motion;Decreased mobility;Decreased balance;Pain       PT Treatment Interventions DME instruction;Gait training;Stair training;Functional mobility training;Therapeutic activities;Therapeutic exercise;Balance training;Patient/family education    PT Goals (Current goals can be found in the Care Plan section)  Acute Rehab PT Goals Patient Stated Goal: To go home PT Goal  Formulation: With patient/family Time For Goal Achievement: 11/15/22 Potential to Achieve Goals: Good    Frequency 7X/week     Co-evaluation               AM-PAC PT "6 Clicks" Mobility  Outcome Measure Help needed turning from your back to your side while in a flat bed without using bedrails?: A Little Help needed moving from lying on your back to sitting on the side of a flat bed without using bedrails?: A Little Help needed moving to and from a bed to a chair (including a wheelchair)?: A Little Help needed standing up from a chair using your arms (e.g., wheelchair or bedside chair)?: None Help needed to walk in hospital room?: A Little Help needed climbing 3-5 steps with a railing? : A Little 6 Click Score: 19    End of Session Equipment Utilized During Treatment: Gait belt Activity Tolerance: Patient tolerated treatment well Patient left: in chair;with call bell/phone within reach;with family/visitor present Nurse Communication: Mobility status PT Visit Diagnosis: Unsteadiness on feet (R26.81);Pain;Other abnormalities of gait and mobility (R26.89) Pain - Right/Left:  (midline) Pain - part of body:  (back)    Time: 0930-1000 PT Time Calculation (min) (ACUTE ONLY): 30 min   Charges:              Malachi Carl, SPT   Malachi Carl 11/01/2022, 11:17 AM

## 2022-11-01 NOTE — Discharge Summary (Addendum)
Discharge Summary  Patient ID: Ralph Giles MRN: [id number] DOB/AGE: Aug 30, 1955 67 y.o.  Admit date: 10/31/2022 Discharge date: 11/01/2022  Admission Diagnoses: M43.16 Spondylolisthesis of lumbar region, M54.42, M54.12m G89.29 Chronic bilateral low back pain with bilateral sciatica .   Discharge Diagnoses:  Principal Problem:   S/P lumbar fusion Active Problems:   Spondylolisthesis of lumbar region   Chronic bilateral low back pain with bilateral sciatica   Discharged Condition: good  Hospital Course:  Ralph Giles is a 67 y.o presenting with lumbar radiculopathy and spondylolisthesis status post L4-5 XLIF and posterior spinal fusion.  His intraoperative course was uncomplicated.  He was admitted overnight for pain management and therapy evaluation.  He was deemed appropriate for discharge home on POD1.   Consults: None  Significant Diagnostic Studies: none  Treatments: surgery: as above. Please see separately dictated operative report for further details  Discharge Exam: Blood pressure 128/65, pulse 60, temperature (!) 97.5 F (36.4 C), resp. rate 17, height 5\' 10"  (1.778 m), weight 125.2 kg, SpO2 95 %. CN II-XII grossly intact 5/5 throughout except 3/5 right HF and KE Incisions with clean post-op bandages in place  Disposition: Discharge disposition: 01-Home or Self Care       Discharge Instructions     Incentive spirometry RT   Complete by: As directed       Allergies as of 11/01/2022       Reactions   Amlodipine Swelling   feet Other reaction(s): Other (See Comments) feet        Medication List     TAKE these medications    baclofen 10 MG tablet Commonly known as: LIORESAL Take 1 tablet (10 mg total) by mouth 3 (three) times daily.   benazepril 40 MG tablet Commonly known as: LOTENSIN Take 1 tablet (40 mg total) by mouth daily. What changed: when to take this   Fish Oil 1000 MG Caps Take 1 capsule (1,000 mg total) by mouth daily.    methylPREDNISolone 4 MG Tbpk tablet Commonly known as: MEDROL DOSEPAK Take as directed on box   oxyCODONE 5 MG immediate release tablet Commonly known as: Oxy IR/ROXICODONE Take 1-2 tablets (5-10 mg total) by mouth every 4 (four) hours as needed for moderate pain or severe pain ((score 4 to 6)).   rosuvastatin 10 MG tablet Commonly known as: CRESTOR TAKE 1 TABLET BY MOUTH EVERY DAY What changed: when to take this   senna 8.6 MG Tabs tablet Commonly known as: SENOKOT Take 1 tablet (8.6 mg total) by mouth 2 (two) times daily.               Durable Medical Equipment  (From admission, onward)           Start     Ordered   11/01/22 1528  For home use only DME Walker rolling  Once       Question Answer Comment  Walker: With 5 Inch Wheels   Patient needs a walker to treat with the following condition S/P lumbar fusion      11/01/22 1527   11/01/22 1317  For home use only DME Walker rolling  Once       Question Answer Comment  Walker: With 5 Inch Wheels   Patient needs a walker to treat with the following condition Weakness generalized      11/01/22 1316            Follow-up Information     Susanne Borders, PA Follow up on 11/15/2022.  Specialty: Neurosurgery Contact information: [address] [place] [address] [phone]                 Signed: Susanne Borders 11/01/2022, 6:01 PM

## 2022-11-01 NOTE — Plan of Care (Signed)
Problem: Education: Goal: Ability to verbalize activity precautions or restrictions will improve 11/01/2022 1822 by Orlean Holtrop, Gurkaran Rahm Bet, LPN Outcome: Adequate for Discharge 11/01/2022 1822 by Kemet Nijjar Bet, LPN Outcome: Progressing 11/01/2022 1116 by Imanii Gosdin Bet, LPN Outcome: Progressing Goal: Knowledge of the prescribed therapeutic regimen will improve 11/01/2022 1822 by Dorinne Graeff Bet, LPN Outcome: Adequate for Discharge 11/01/2022 1822 by Meyli Boice Bet, LPN Outcome: Progressing 11/01/2022 1116 by Alveria Mcglaughlin Bet, LPN Outcome: Progressing Goal: Understanding of discharge needs will improve 11/01/2022 1822 by Charlot Gouin Bet, LPN Outcome: Adequate for Discharge 11/01/2022 1822 by Pearson Picou Bet, LPN Outcome: Progressing 11/01/2022 1116 by Ambika Zettlemoyer Bet, LPN Outcome: Progressing   Problem: Activity: Goal: Ability to avoid complications of mobility impairment will improve 11/01/2022 1822 by Emmitt Matthews Bet, LPN Outcome: Adequate for Discharge 11/01/2022 1822 by Tannia Contino Bet, LPN Outcome: Progressing 11/01/2022 1116 by Mathews Stuhr Bet, LPN Outcome: Progressing Goal: Ability to tolerate increased activity will improve 11/01/2022 1822 by Keegan Bensch Bet, LPN Outcome: Adequate for Discharge 11/01/2022 1822 by Jaqualin Serpa Bet, LPN Outcome: Progressing 11/01/2022 1116 by Raegan Sipp Bet, LPN Outcome: Progressing Goal: Will remain free from falls 11/01/2022 1822 by Renaud Celli Bet, LPN Outcome: Adequate for Discharge 11/01/2022 1822 by Christinamarie Tall Bet, LPN Outcome: Progressing 11/01/2022 1116 by Emillia Weatherly Bet, LPN Outcome: Progressing   Problem: Bowel/Gastric: Goal: Gastrointestinal status for postoperative course will improve 11/01/2022 1822 by Hurley Sobel Bet, LPN Outcome: Adequate for Discharge 11/01/2022 1822 by Jolette Lana Bet, LPN Outcome: Progressing 11/01/2022 1116 by James Senn Bet, LPN Outcome: Progressing   Problem: Clinical Measurements: Goal: Ability to maintain clinical  measurements within normal limits will improve 11/01/2022 1822 by Dejanay Wamboldt, Audriella Blakeley Bet, LPN Outcome: Adequate for Discharge 11/01/2022 1822 by Latrel Szymczak Bet, LPN Outcome: Progressing 11/01/2022 1116 by Dariela Stoker Bet, LPN Outcome: Progressing Goal: Postoperative complications will be avoided or minimized 11/01/2022 1822 by Nerida Boivin Bet, LPN Outcome: Adequate for Discharge 11/01/2022 1822 by Aradia Estey Bet, LPN Outcome: Progressing 11/01/2022 1116 by Alara Daniel Bet, LPN Outcome: Progressing Goal: Diagnostic test results will improve 11/01/2022 1822 by Dawsen Krieger Bet, LPN Outcome: Adequate for Discharge 11/01/2022 1822 by Jhan Conery Bet, LPN Outcome: Progressing 11/01/2022 1116 by Stephani Janak Bet, LPN Outcome: Progressing   Problem: Pain Management: Goal: Pain level will decrease 11/01/2022 1822 by Bexley Mclester Bet, LPN Outcome: Adequate for Discharge 11/01/2022 1822 by Trica Usery Bet, LPN Outcome: Progressing 11/01/2022 1116 by Aislinn Feliz Bet, LPN Outcome: Progressing   Problem: Skin Integrity: Goal: Will show signs of wound healing 11/01/2022 1822 by Archer Asa, Thoren Hosang Bet, LPN Outcome: Adequate for Discharge 11/01/2022 1822 by Julieta Rogalski Bet, LPN Outcome: Progressing 11/01/2022 1116 by Niesha Bame Bet, LPN Outcome: Progressing   Problem: Health Behavior/Discharge Planning: Goal: Identification of resources available to assist in meeting health care needs will improve 11/01/2022 1822 by Shaney Deckman Bet, LPN Outcome: Adequate for Discharge 11/01/2022 1822 by Wilba Mutz Bet, LPN Outcome: Progressing 11/01/2022 1116 by Santiago Stenzel Bet, LPN Outcome: Progressing   Problem: Bladder/Genitourinary: Goal: Urinary functional status for postoperative course will improve 11/01/2022 1822 by Cowan Pilar Bet, LPN Outcome: Adequate for Discharge 11/01/2022 1822 by Cecile Guevara Bet, LPN Outcome: Progressing 11/01/2022 1116 by Rosemond Lyttle Bet, LPN Outcome: Progressing   Problem: Education: Goal: Knowledge of General  Education information will improve Description: Including pain rating scale, medication(s)/side effects and non-pharmacologic comfort measures 11/01/2022 1822 by Irmalee Riemenschneider Bet, LPN Outcome: Adequate for Discharge 11/01/2022 1822 by Archer Asa, Denai Caba  Bet, LPN Outcome: Progressing 11/01/2022 1116 by Dayane Hillenburg Bet, LPN Outcome: Progressing   Problem: Health Behavior/Discharge Planning: Goal: Ability to manage health-related needs will improve 11/01/2022 1822 by Yoshika Vensel Bet, LPN Outcome: Adequate for Discharge 11/01/2022 1822 by Maryjayne Kleven Bet, LPN Outcome: Progressing 11/01/2022 1116 by Dezhane Staten Bet, LPN Outcome: Progressing   Problem: Clinical Measurements: Goal: Ability to maintain clinical measurements within normal limits will improve 11/01/2022 1822 by Whyatt Klinger Bet, LPN Outcome: Adequate for Discharge 11/01/2022 1822 by Charlen Bakula Bet, LPN Outcome: Progressing 11/01/2022 1116 by Aliceson Dolbow Bet, LPN Outcome: Progressing Goal: Will remain free from infection 11/01/2022 1822 by Henry Demeritt Bet, LPN Outcome: Adequate for Discharge 11/01/2022 1822 by Bricelyn Freestone Bet, LPN Outcome: Progressing 11/01/2022 1116 by Tirrell Buchberger Bet, LPN Outcome: Progressing Goal: Diagnostic test results will improve 11/01/2022 1822 by Kambre Messner Bet, LPN Outcome: Adequate for Discharge 11/01/2022 1822 by Jaymie Mckiddy Bet, LPN Outcome: Progressing 11/01/2022 1116 by Haruna Rohlfs Bet, LPN Outcome: Progressing Goal: Respiratory complications will improve 11/01/2022 1822 by Suriah Peragine Bet, LPN Outcome: Adequate for Discharge 11/01/2022 1822 by Murry Diaz Bet, LPN Outcome: Progressing 11/01/2022 1116 by Vasily Fedewa Bet, LPN Outcome: Progressing Goal: Cardiovascular complication will be avoided 11/01/2022 1822 by Camerin Jimenez Bet, LPN Outcome: Adequate for Discharge 11/01/2022 1822 by Navpreet Szczygiel Bet, LPN Outcome: Progressing 11/01/2022 1116 by Nima Bamburg, Riaz Onorato Bet, LPN Outcome: Progressing   Problem: Activity: Goal: Risk for  activity intolerance will decrease 11/01/2022 1822 by Escarlet Saathoff Bet, LPN Outcome: Adequate for Discharge 11/01/2022 1822 by Cherril Hett Bet, LPN Outcome: Progressing 11/01/2022 1116 by Isham Smitherman Bet, LPN Outcome: Progressing   Problem: Nutrition: Goal: Adequate nutrition will be maintained 11/01/2022 1822 by Judit Awad Bet, LPN Outcome: Adequate for Discharge 11/01/2022 1822 by Jameika Kinn Bet, LPN Outcome: Progressing 11/01/2022 1116 by Raliyah Montella Bet, LPN Outcome: Progressing   Problem: Coping: Goal: Level of anxiety will decrease 11/01/2022 1822 by Madellyn Denio Bet, LPN Outcome: Adequate for Discharge 11/01/2022 1822 by Tishawn Friedhoff Bet, LPN Outcome: Progressing 11/01/2022 1116 by Zaina Jenkin Bet, LPN Outcome: Progressing   Problem: Elimination: Goal: Will not experience complications related to bowel motility 11/01/2022 1822 by Betrice Wanat Bet, LPN Outcome: Adequate for Discharge 11/01/2022 1822 by Tariyah Pendry Bet, LPN Outcome: Progressing 11/01/2022 1116 by Dusten Ellinwood Bet, LPN Outcome: Progressing Goal: Will not experience complications related to urinary retention 11/01/2022 1822 by Zanaya Baize Bet, LPN Outcome: Adequate for Discharge 11/01/2022 1822 by Tian Mcmurtrey Bet, LPN Outcome: Progressing 11/01/2022 1116 by Carmilla Granville Bet, LPN Outcome: Progressing   Problem: Pain Managment: Goal: General experience of comfort will improve 11/01/2022 1822 by Glorene Leitzke Bet, LPN Outcome: Adequate for Discharge 11/01/2022 1822 by Kimbree Casanas Bet, LPN Outcome: Progressing 11/01/2022 1116 by Kanylah Muench Bet, LPN Outcome: Progressing   Problem: Safety: Goal: Ability to remain free from injury will improve 11/01/2022 1822 by Darrel Gloss Bet, LPN Outcome: Adequate for Discharge 11/01/2022 1822 by Geovanny Sartin Bet, LPN Outcome: Progressing 11/01/2022 1116 by Demetrius Mahler Bet, LPN Outcome: Progressing   Problem: Skin Integrity: Goal: Risk for impaired skin integrity will decrease 11/01/2022 1822 by Yolando Gillum,  Tassie Pollett Bet, LPN Outcome: Adequate for Discharge 11/01/2022 1822 by Arayah Krouse Bet, LPN Outcome: Progressing 11/01/2022 1116 by Marcha Licklider Bet, LPN Outcome: Progressing

## 2022-11-01 NOTE — Progress Notes (Signed)
   Neurosurgery Progress Note  History: Alex R Seguin is s/p L4-5 XLIF   POD1: Pt complaining of some right groin pain this morning.  Feels his pain is well-controlled on oral medications.  Physical Exam: Vitals:   11/01/22 0133 11/01/22 0800  BP: 123/62 127/68  Pulse: 67 66  Resp: 20   Temp: 98 F (36.7 C) 98 F (36.7 C)  SpO2: 96% 95%    AA Ox3 CNI  Strength:5/5 throughout BLE except 3/5 right HF incisions with clean postoperative dressings in place  Data:  Other tests/results: \None  Assessment/Plan:  [name] is a 67 year old presenting with lumbar spondylolisthesis and bilateral sciatica status post L4-5 XLIF with PSF.  He reports resolution of his radiating leg pain this morning  - mobilize - pain control - DVT prophylaxis - PTOT; [facility] set up pre-op - LSO brace when OOB  Manning Charity PA-C Department of Neurosurgery

## 2022-11-01 NOTE — Discharge Instructions (Signed)
Your surgeon has performed an operation on your lumbar spine (low back) to relieve pressure on one or more nerves. Many times, patients feel better immediately after surgery and can "overdo it." Even if you feel well, it is important that you follow these activity guidelines. If you do not let your back heal properly from the surgery, you can increase the chance of hardware complications and/or return of your symptoms. The following are instructions to help in your recovery once you have been discharged from the hospital.  Do not use NSAIDs for 3 months after surgery.   Activity    No bending, lifting, or twisting ("BLT"). Avoid lifting objects heavier than 10 pounds (gallon milk jug).  Where possible, avoid household activities that involve lifting, bending, pushing, or pulling such as laundry, vacuuming, grocery shopping, and childcare. Try to arrange for help from friends and family for these activities while your back heals.  Increase physical activity slowly as tolerated.  Taking short walks is encouraged, but avoid strenuous exercise. Do not jog, run, bicycle, lift weights, or participate in any other exercises unless specifically allowed by your doctor. Avoid prolonged sitting, including car rides.  Talk to your doctor before resuming sexual activity.  You should not drive until cleared by your doctor.  Until released by your doctor, you should not return to work or school.  You should rest at home and let your body heal.   You may shower three days after your surgery.  After showering, lightly dab your incision dry. Do not take a tub bath or go swimming for 3 weeks, or until approved by your doctor at your follow-up appointment.  If you smoke, we strongly recommend that you quit.  Smoking has been proven to interfere with normal healing in your back and will dramatically reduce the success rate of your surgery. Please contact [facility] (800-QUIT-NOW) and use the resources at  www.[facility].com for assistance in stopping smoking.  Surgical Incision   If you have a dressing on your incision, you may remove it three days after your surgery. Keep your incision area clean and dry.  Your incision was closed with Dermabond glue. The glue should begin to peel away within about a week.  Diet            You may return to your usual diet. Be sure to stay hydrated.  When to Contact Us  Although your surgery and recovery will likely be uneventful, you may have some residual numbness, aches, and pains in your back and/or legs. This is normal and should improve in the next few weeks.  However, should you experience any of the following, contact us immediately: New numbness or weakness Pain that is progressively getting worse, and is not relieved by your pain medications or rest Bleeding, redness, swelling, pain, or drainage from surgical incision Chills or flu-like symptoms Fever greater than 101.0 F (38.3 C) Problems with bowel or bladder functions Difficulty breathing or shortness of breath Warmth, tenderness, or swelling in your calf  Contact Information During office hours (Monday-Friday 9 am to 5 pm), please call your physician at [phone] and ask for Kendelyn Jean After hours and weekends, please call [phone] and speak with the neurosurgeon on call For a life-threatening emergency, call 911 Your surgeon has performed an operation on your lumbar spine (low back) to relieve pressure on one or more nerves. Many times, patients feel better immediately after surgery and can "overdo it." Even if you feel well, it is important that   you follow these activity guidelines. If you do not let your back heal properly from the surgery, you can increase the chance of hardware complications and/or return of your symptoms. The following are instructions to help in your recovery once you have been discharged from the hospital.  Do not use NSAIDs for 3 months after surgery.    Activity    No bending, lifting, or twisting ("BLT"). Avoid lifting objects heavier than 10 pounds (gallon milk jug).  Where possible, avoid household activities that involve lifting, bending, pushing, or pulling such as laundry, vacuuming, grocery shopping, and childcare. Try to arrange for help from friends and family for these activities while your back heals.  Increase physical activity slowly as tolerated.  Taking short walks is encouraged, but avoid strenuous exercise. Do not jog, run, bicycle, lift weights, or participate in any other exercises unless specifically allowed by your doctor. Avoid prolonged sitting, including car rides.  Talk to your doctor before resuming sexual activity.  You should not drive until cleared by your doctor.  Until released by your doctor, you should not return to work or school.  You should rest at home and let your body heal.   You may shower three days after your surgery.  After showering, lightly dab your incision dry. Do not take a tub bath or go swimming for 3 weeks, or until approved by your doctor at your follow-up appointment.  If you smoke, we strongly recommend that you quit.  Smoking has been proven to interfere with normal healing in your back and will dramatically reduce the success rate of your surgery. Please contact [facility] (800-QUIT-NOW) and use the resources at www.[facility]com for assistance in stopping smoking.  Surgical Incision   If you have a dressing on your incision, you may remove it three days after your surgery. Keep your incision area clean and dry.  Your incision was closed with Dermabond glue. The glue should begin to peel away within about a week.  Diet            You may return to your usual diet. Be sure to stay hydrated.  When to Contact Us  Although your surgery and recovery will likely be uneventful, you may have some residual numbness, aches, and pains in your back and/or legs. This is normal and should  improve in the next few weeks.  However, should you experience any of the following, contact us immediately: New numbness or weakness Pain that is progressively getting worse, and is not relieved by your pain medications or rest Bleeding, redness, swelling, pain, or drainage from surgical incision Chills or flu-like symptoms Fever greater than 101.0 F (38.3 C) Problems with bowel or bladder functions Difficulty breathing or shortness of breath Warmth, tenderness, or swelling in your calf  Contact Information During office hours (Monday-Friday 9 am to 5 pm), please call your physician at [phone] and ask for Kendelyn Jean After hours and weekends, please call [phone] and speak with the neurosurgeon on call For a life-threatening emergency, call 911 

## 2022-11-01 NOTE — Evaluation (Signed)
Occupational Therapy Evaluation Patient Details Name: Ralph Giles MRN: [id number] DOB: February 06, 1956 Today's Date: 11/01/2022   History of Present Illness Ralph Giles is a 67 y.o male admitted s/p lumbar fusion. pmh of sponylolithesis, chronic LBP, arthritis, dysmetabolic syndrome, LHD, HTN, obesity and sleep apnea.   Clinical Impression   Upon entering the room, pt supine in bed with wife present in room. Pt reports living at home Ind with wife at baseline. OT reviewed back precautions and educated pt on precautions with self care tasks. Pt able to perform logroll to EOB with supervision and cuing for technique but no physical assistance. Pt dons clothing items with supervision for sit <>stand. Pt feels confident about returning home. OT educated pt on energy conservation techniques. Pt with no further education needed and OT to sign off with pt agreeable.      Recommendations for follow up therapy are one component of a multi-disciplinary discharge planning process, led by the attending physician.  Recommendations may be updated based on patient status, additional functional criteria and insurance authorization.   Assistance Recommended at Discharge Set up Supervision/Assistance  Patient can return home with the following Assistance with cooking/housework;Assist for transportation;A little help with bathing/dressing/bathroom;A little help with walking and/or transfers    Functional Status Assessment  Patient has had a recent decline in their functional status and demonstrates the ability to make significant improvements in function in a reasonable and predictable amount of time.  Equipment Recommendations  None recommended by OT       Precautions / Restrictions Precautions Precautions: Back Precaution Booklet Issued: No Precaution Comments: No bending, lifting, twisting Restrictions Weight Bearing Restrictions: No      Mobility Bed Mobility Overal bed mobility: Needs  Assistance Bed Mobility: Supine to Sit, Sit to Supine     Supine to sit: Supervision Sit to supine: Supervision   General bed mobility comments: no physical assistance but needing cuing for log roll    Transfers Overall transfer level: Needs assistance Equipment used: Rolling walker (2 wheels) Transfers: Sit to/from Stand Sit to Stand: Supervision                  Balance Overall balance assessment: Needs assistance Sitting-balance support: Bilateral upper extremity supported, Feet supported Sitting balance-Leahy Scale: Normal     Standing balance support: Bilateral upper extremity supported, During functional activity, Reliant on assistive device for balance Standing balance-Leahy Scale: Good                             ADL either performed or assessed with clinical judgement   ADL                                         General ADL Comments: supervision overall     Vision Patient Visual Report: No change from baseline              Pertinent Vitals/Pain Pain Assessment Pain Assessment: 0-10 Pain Score: 6  Pain Location: back Pain Descriptors / Indicators: Dull, Discomfort, Tightness Pain Intervention(s): Limited activity within patient's tolerance, Monitored during session, Repositioned        Extremity/Trunk Assessment Upper Extremity Assessment Upper Extremity Assessment: Overall WFL for tasks assessed   Lower Extremity Assessment Lower Extremity Assessment: Overall WFL for tasks assessed       Communication Communication Communication: No difficulties  Cognition Arousal/Alertness: Awake/alert Behavior During Therapy: WFL for tasks assessed/performed Overall Cognitive Status: Within Functional Limits for tasks assessed                                                  Home Living Family/patient expects to be discharged to:: Private residence Living Arrangements: Spouse/significant  other Available Help at Discharge: Family;Available 24 hours/day Type of Home: House Home Access: Stairs to enter [employer] of Steps: 5 Entrance Stairs-Rails: Can reach both Home Layout: One level     Bathroom Shower/Tub: Producer, television/film/video: Handicapped height Bathroom Accessibility: Yes   Home Equipment: Standard Walker;BSC/3in1;Shower seat - built in;Toilet riser          Prior Functioning/Environment Prior Level of Function : Independent/Modified Independent             Mobility Comments: IND, states he had pain waling greater than 5 feet but required no assistance. ADLs Comments: IND                 OT Goals(Current goals can be found in the care plan section) Acute Rehab OT Goals Patient Stated Goal: to go home OT Goal Formulation: With patient/family Time For Goal Achievement: 11/01/22 Potential to Achieve Goals: Fair  OT Frequency:         AM-PAC OT "6 Clicks" Daily Activity     Outcome Measure Help from another person eating meals?: None Help from another person taking care of personal grooming?: None Help from another person toileting, which includes using toliet, bedpan, or urinal?: A Little Help from another person bathing (including washing, rinsing, drying)?: A Little Help from another person to put on and taking off regular upper body clothing?: None Help from another person to put on and taking off regular lower body clothing?: None 6 Click Score: 22   End of Session Equipment Utilized During Treatment: Rolling walker (2 wheels) Nurse Communication: Mobility status  Activity Tolerance: Patient tolerated treatment well Patient left: in bed;with call bell/phone within reach;with bed alarm set                   Time: 1052-1110 OT Time Calculation (min): 18 min Charges:  OT General Charges $OT Visit: 1 Visit OT Evaluation $OT Eval Low Complexity: 1 Low OT Treatments $Self Care/Home Management : 8-22  mins  Jackquline Denmark, MS, OTR/L , CBIS ascom [phone]  11/01/22, 1:18 PM

## 2022-11-01 NOTE — Progress Notes (Signed)
Orthopedic Tech Progress Note Patient Details:  Ralph Giles August 02, 1955 [id number]  Order for LSO called into [facility].  Patient ID: Ralph Giles, male   DOB: [birth year], 67 y.o.   MRN: [id number]  Docia Furl 11/01/2022, 12:51 PM

## 2022-11-01 NOTE — TOC Transition Note (Signed)
Transition of Care [facility]) - CM/SW Discharge Note   Patient Details  Name: Ralph Giles MRN: [id number] Date of Birth: [birth year]  Transition of Care [facility]) CM/SW Contact:  Garret Reddish, RN Phone Number: 11/01/2022, 4:03 PM   Clinical Narrative:   Chart reviewed.  Noted that patient has  orders for discharge today.    Noted that patient has been pre-arranged with [place] for [facility].  Patient would like to use their services on discharge.  [facility] will provide home health PT services.  I have informed Amy with [facility] that patient will be a discharge for today.    I have asked John with [employer] to provide patient will at 2 wheeled rolling walker.    I have informed staff nurse of the above information.      Final next level of care: Home w Home Health Services Barriers to Discharge: No Barriers Identified   Patient Goals and CMS Choice CMS Medicare.gov Compare Post Acute Care list provided to:: Patient Choice offered to / list presented to : Patient  Discharge Placement                         Discharge Plan and Services Additional resources added to the After Visit Summary for                  DME Arranged: Walker rolling DME Agency: [employer] Date DME Agency Contacted: 11/01/22 Time DME Agency Contacted: 1530 Representative spoke with at DME Agency: Esperanza Richters Arranged: PT HH Agency: [facility] Date [facility] Agency Contacted: 11/01/22 Time HH Agency Contacted: 1530 Representative spoke with at [facility] Agency: Amy  Social Determinants of Health (SDOH) Interventions SDOH Screenings   Food Insecurity: No Food Insecurity (10/31/2022)  Housing: Low Risk  (10/31/2022)  Transportation Needs: No Transportation Needs (10/31/2022)  Utilities: Not At Risk (10/31/2022)  Depression (PHQ2-9): Low Risk  (10/08/2022)  Tobacco Use: Low Risk  (11/01/2022)  Recent Concern: Tobacco Use - Medium Risk (10/08/2022)     Readmission Risk Interventions     No data  to display

## 2022-11-01 NOTE — Progress Notes (Signed)
Reviewed discharge instructions with pt. PT verbalized understanding. Pt discharged with LSO brace, walker, and all personal belongings. Staff wheeled pt out. Pt transported to home via family private car.

## 2022-11-01 NOTE — Plan of Care (Signed)
  Problem: Activity: Goal: Ability to avoid complications of mobility impairment will improve Outcome: Progressing   Problem: Skin Integrity: Goal: Will show signs of wound healing Outcome: Progressing   Problem: Pain Management: Goal: Pain level will decrease Outcome: Progressing   Problem: Safety: Goal: Ability to remain free from injury will improve Outcome: Progressing   Problem: Skin Integrity: Goal: Risk for impaired skin integrity will decrease Outcome: Progressing

## 2022-11-02 ENCOUNTER — Telehealth: Payer: Self-pay | Admitting: *Deleted

## 2022-11-02 ENCOUNTER — Telehealth: Payer: Self-pay | Admitting: Neurosurgery

## 2022-11-02 ENCOUNTER — Encounter: Payer: Self-pay | Admitting: *Deleted

## 2022-11-02 NOTE — Transitions of Care (Post Inpatient/ED Visit) (Signed)
11/02/2022  Name: Ralph Giles MRN: [id number] DOB: Aug 19, 1955  Today's TOC FU Call Status: Today's TOC FU Call Status:: Successful TOC FU Call Competed TOC FU Call Complete Date: 11/02/22  Transition Care Management Follow-up Telephone Call Date of Discharge: 11/01/22 Discharge Facility: [facility]) Type of Discharge: Inpatient Admission Primary Inpatient Discharge Diagnosis:: surgical L4-5 lumbar fusion How have you been since you were released from the hospital?: Better ("I am doing okay, no problems; did well with the surgery and following their instructions.  Pain isn't too bad.  Using the walker.  Home Health PT will be coming out to see me tomorrow") Any questions or concerns?: No  Items Reviewed: Did you receive and understand the discharge instructions provided?: Yes (thoroughly reviewed with patient who verbalizes good understanding of same) Medications obtained,verified, and reconciled?: Yes (Medications Reviewed) (Full medication reconciliation/ review completed; no concerns or discrepancies identified; confirmed patient obtained/ is taking all newly Rx'd medications as instructed; self-manages medications and denies questions/ concerns around medications today) Any new allergies since your discharge?: No Dietary orders reviewed?: Yes Type of Diet Ordered:: "Regular, I am watching my diet to make sure I am eating healthier than I used to" Do you have support at home?: Yes People in Home: spouse Name of Support/Comfort Primary Source: Reports independent in self-care activities; supportive spouse assists as/ if needed/ indicated  Medications Reviewed Today: Medications Reviewed Today     Reviewed by Michaela Corner, RN (Registered Nurse) on 11/02/22 at 1257  Med List Status: <None>   Medication Order Taking? Sig Documenting Provider Last Dose Status Informant  baclofen (LIORESAL) 10 MG tablet [id number] Yes Take 1 tablet (10 mg total) by  mouth 3 (three) times daily. Susanne Borders, PA Taking Active   benazepril (LOTENSIN) 40 MG tablet [id number] Yes Take 1 tablet (40 mg total) by mouth daily.  Patient taking differently: Take 40 mg by mouth every morning.   Duanne Limerick, MD Taking Active   methylPREDNISolone (MEDROL DOSEPAK) 4 MG TBPK tablet [id number] Yes Take as directed on box Susanne Borders, [place] Taking Active   Omega-3 Fatty Acids (FISH OIL) 1000 MG CAPS [id number] Yes Take 1 capsule (1,000 mg total) by mouth daily. Duanne Limerick, MD Taking Active   oxyCODONE (OXY IR/ROXICODONE) 5 MG immediate release tablet [id number] Yes Take 1-2 tablets (5-10 mg total) by mouth every 4 (four) hours as needed for moderate pain or severe pain ((score 4 to 6)). Susanne Borders, PA Taking Active   rosuvastatin (CRESTOR) 10 MG tablet [id number] Yes TAKE 1 TABLET BY MOUTH EVERY DAY  Patient taking differently: Take 10 mg by mouth every morning.   Duanne Limerick, MD Taking Active   senna (SENOKOT) 8.6 MG TABS tablet [id number] Yes Take 1 tablet (8.6 mg total) by mouth 2 (two) times daily. Susanne Borders, PA Taking Active             Home Care and Equipment/Supplies: Were Home Health Services Ordered?: Yes Name of Home Health Agency:: [phone] PT Has Agency set up a time to come to your home?: Yes First Home Health Visit Date: 11/03/22 Any new equipment or medical supplies ordered?: Yes (rolling walker) Name of Medical supply agency?: [facility] Were you able to get the equipment/medical supplies?: Yes Do you have any questions related to the use of the equipment/supplies?: No  Functional Questionnaire: Do you need assistance with bathing/showering or dressing?: No Do you need assistance with meal preparation?: No  Do you need assistance with eating?: No Do you have difficulty maintaining continence: No Do you need assistance with getting out of bed/getting out of a chair/moving?: No Do you have difficulty managing or taking  your medications?: No  Follow up appointments reviewed: PCP Follow-up appointment confirmed?: NA Specialist Hospital Follow-up appointment confirmed?: Yes Date of Specialist follow-up appointment?: 11/15/22 Follow-Up Specialty Provider:: neurosurgeon Do you need transportation to your follow-up appointment?: No Do you understand care options if your condition(s) worsen?: Yes-patient verbalized understanding  SDOH Interventions Today    Flowsheet Row Most Recent Value  SDOH Interventions   Food Insecurity Interventions Intervention Not Indicated  Transportation Interventions Intervention Not Indicated  [normally drives self- spouse assisting post-recent surgery]      TOC Interventions Today    Flowsheet Row Most Recent Value  TOC Interventions   TOC Interventions Discussed/Reviewed TOC Interventions Discussed      Interventions Today    Flowsheet Row Most Recent Value  Chronic Disease   Chronic disease during today's visit Other  [neurosurgical L 4-5 lumbar fusion]  General Interventions   General Interventions Discussed/Reviewed General Interventions Discussed, Doctor Visits, Durable Medical Equipment (DME)  Doctor Visits Discussed/Reviewed Doctor Visits Discussed, Specialist, PCP  Durable Medical Equipment (DME) Val Riles obtained and using rolling walker post-recent surgery]  PCP/Specialist Visits Compliance with follow-up visit  Education Interventions   Education Provided Provided Education  Provided Verbal Education On Other  [purpose of/ importance of having updated DPR completed]  Nutrition Interventions   Nutrition Discussed/Reviewed Nutrition Discussed  Pharmacy Interventions   Pharmacy Dicussed/Reviewed Pharmacy Topics Discussed  [Full medication review with updating medication list in EHR per patient report]  Safety Interventions   Safety Discussed/Reviewed Safety Discussed      Caryl Pina, RN, BSN, CCRN Alumnus RN CM Care Coordination/  Transition of Care- [facility] Care Management [phone]: direct office

## 2022-11-02 NOTE — Telephone Encounter (Signed)
I confirmed with Ralph Giles that he was receiving lovenox injections while admitted and does not need these at home. I notified Ralph Giles of this and encouraged him to walk as tolerated to minimize his risk of DVT. He verbalized understanding.

## 2022-11-02 NOTE — Telephone Encounter (Signed)
Sx 2 days ago, pt called in to say that while at the hospital, someone mentioned injections for blood clots. He said that when he was discharged though, he never got rcvd any further info about these injections. Requesting a call back to get more info.

## 2022-11-03 DIAGNOSIS — Z981 Arthrodesis status: Secondary | ICD-10-CM | POA: Diagnosis not present

## 2022-11-03 DIAGNOSIS — I10 Essential (primary) hypertension: Secondary | ICD-10-CM | POA: Diagnosis not present

## 2022-11-03 DIAGNOSIS — E785 Hyperlipidemia, unspecified: Secondary | ICD-10-CM | POA: Diagnosis not present

## 2022-11-03 DIAGNOSIS — Z4789 Encounter for other orthopedic aftercare: Secondary | ICD-10-CM | POA: Diagnosis not present

## 2022-11-03 DIAGNOSIS — Z79891 Long term (current) use of opiate analgesic: Secondary | ICD-10-CM | POA: Diagnosis not present

## 2022-11-06 DIAGNOSIS — Z79891 Long term (current) use of opiate analgesic: Secondary | ICD-10-CM | POA: Diagnosis not present

## 2022-11-06 DIAGNOSIS — I10 Essential (primary) hypertension: Secondary | ICD-10-CM | POA: Diagnosis not present

## 2022-11-06 DIAGNOSIS — E785 Hyperlipidemia, unspecified: Secondary | ICD-10-CM | POA: Diagnosis not present

## 2022-11-06 DIAGNOSIS — Z981 Arthrodesis status: Secondary | ICD-10-CM | POA: Diagnosis not present

## 2022-11-06 DIAGNOSIS — Z4789 Encounter for other orthopedic aftercare: Secondary | ICD-10-CM | POA: Diagnosis not present

## 2022-11-07 DIAGNOSIS — Z79891 Long term (current) use of opiate analgesic: Secondary | ICD-10-CM | POA: Diagnosis not present

## 2022-11-07 DIAGNOSIS — Z4789 Encounter for other orthopedic aftercare: Secondary | ICD-10-CM | POA: Diagnosis not present

## 2022-11-07 DIAGNOSIS — E785 Hyperlipidemia, unspecified: Secondary | ICD-10-CM | POA: Diagnosis not present

## 2022-11-07 DIAGNOSIS — Z981 Arthrodesis status: Secondary | ICD-10-CM | POA: Diagnosis not present

## 2022-11-07 DIAGNOSIS — I10 Essential (primary) hypertension: Secondary | ICD-10-CM | POA: Diagnosis not present

## 2022-11-09 ENCOUNTER — Telehealth: Payer: Self-pay | Admitting: Family Medicine

## 2022-11-09 ENCOUNTER — Other Ambulatory Visit: Payer: Self-pay | Admitting: Neurosurgery

## 2022-11-09 ENCOUNTER — Telehealth: Payer: Self-pay

## 2022-11-09 DIAGNOSIS — Z4789 Encounter for other orthopedic aftercare: Secondary | ICD-10-CM | POA: Diagnosis not present

## 2022-11-09 DIAGNOSIS — Z79891 Long term (current) use of opiate analgesic: Secondary | ICD-10-CM | POA: Diagnosis not present

## 2022-11-09 DIAGNOSIS — Z981 Arthrodesis status: Secondary | ICD-10-CM | POA: Diagnosis not present

## 2022-11-09 DIAGNOSIS — E785 Hyperlipidemia, unspecified: Secondary | ICD-10-CM | POA: Diagnosis not present

## 2022-11-09 DIAGNOSIS — I10 Essential (primary) hypertension: Secondary | ICD-10-CM | POA: Diagnosis not present

## 2022-11-09 MED ORDER — OXYCODONE HCL 5 MG PO TABS: 5.0000 mg | ORAL_TABLET | ORAL | 0 refills | Status: DC | PRN

## 2022-11-09 NOTE — Telephone Encounter (Signed)
Copied from CRM [id number]. Topic: General - Other >> Nov 09, 2022 10:41 AM Macon Large wrote: Reason for CRM: Pt requests that Delice Bison return his call at [phone]

## 2022-11-09 NOTE — Telephone Encounter (Signed)
Ralph Giles left a voicemail that he needs his oxycodone refilled prior to Monday. I left a message on his personal voicemail that Dr Myer Haff sent in meds to [facility] and if he needs anything further to contact the answering service after 4pm today as the office is closed.

## 2022-11-09 NOTE — Telephone Encounter (Signed)
Pt stated he was going to run out of his "pain medicine before Monday and can't get anyone at the office." I sent Dr Marcell Barlow and Manning Charity a chat message, but haven't heard anything. I called the office and left a message for them to return the call. I explained to pt they would have to refill his oxy/ pain med, but I could do the above and let him know what I found out.

## 2022-11-12 ENCOUNTER — Other Ambulatory Visit: Payer: Self-pay | Admitting: Family Medicine

## 2022-11-12 DIAGNOSIS — Z981 Arthrodesis status: Secondary | ICD-10-CM | POA: Diagnosis not present

## 2022-11-12 DIAGNOSIS — Z4789 Encounter for other orthopedic aftercare: Secondary | ICD-10-CM | POA: Diagnosis not present

## 2022-11-12 DIAGNOSIS — E782 Mixed hyperlipidemia: Secondary | ICD-10-CM

## 2022-11-12 DIAGNOSIS — I10 Essential (primary) hypertension: Secondary | ICD-10-CM | POA: Diagnosis not present

## 2022-11-12 DIAGNOSIS — E785 Hyperlipidemia, unspecified: Secondary | ICD-10-CM | POA: Diagnosis not present

## 2022-11-12 DIAGNOSIS — Z79891 Long term (current) use of opiate analgesic: Secondary | ICD-10-CM | POA: Diagnosis not present

## 2022-11-14 DIAGNOSIS — Z981 Arthrodesis status: Secondary | ICD-10-CM | POA: Diagnosis not present

## 2022-11-14 DIAGNOSIS — I10 Essential (primary) hypertension: Secondary | ICD-10-CM | POA: Diagnosis not present

## 2022-11-14 DIAGNOSIS — Z79891 Long term (current) use of opiate analgesic: Secondary | ICD-10-CM | POA: Diagnosis not present

## 2022-11-14 DIAGNOSIS — E785 Hyperlipidemia, unspecified: Secondary | ICD-10-CM | POA: Diagnosis not present

## 2022-11-14 DIAGNOSIS — Z4789 Encounter for other orthopedic aftercare: Secondary | ICD-10-CM | POA: Diagnosis not present

## 2022-11-14 NOTE — Progress Notes (Unsigned)
   REFERRING PHYSICIAN:  Duanne Limerick, Md [address] [place],  [place] [address]  DOS: 10/29/22 L4-5 XLIF/PSF  HISTORY OF PRESENT ILLNESS: Ralph Giles is approximately 2 weeks status post XLIF. he is doing relatively well after his recent lumbar fusion.  He continues to have some right groin and anterior thigh pain however his preoperative symptoms have resolved.  He continues to take oxycodone 10 mg morning and night and 5 mg in the middle of the day as well as Robaxin.  Overall he is pleased with his postoperative recovery thus far.  PHYSICAL EXAMINATION:  General: Patient is well developed, well nourished, calm, collected, and in no apparent distress.   NEUROLOGICAL:  General: In no acute distress.   Awake, alert, oriented to person, place, and time.  Pupils equal round and reactive to light.    Strength:            Side Iliopsoas Quads Hamstring PF DF EHL  R 4- 5 5 5 5 5   L 5 5 5 5 5 5    Incisions c/d/I and healing well   ROS (Neurologic):  Negative except as noted above  IMAGING: No interval imaging to review  ASSESSMENT/PLAN:  Ralph Giles is doing relatively well approximately 2 weeks after XLIF.  We discussed that his right groin pain is likely due to psoas irritation and should improve with time however we did briefly discuss other additional potential differential diagnoses.  He will let us know if his symptoms or not improving over the course of the next couple weeks and we will work it up further if needed.  I have provided him with a refill of his oxycodone as requested.  We discussed activity escalation and I have advised the patient to lift up to 10 pounds until 6 weeks after surgery, then increase up to 25 pounds until 12 weeks after surgery.  After 12 weeks post-op, the patient advised to increase activity as tolerated.  he will follow up with Dr. Myer Haff in 4 weeks with lumbar xrays or sooner should he have any questions or concerns.    Advised to contact the office if any questions or concerns arise.  Manning Charity PA-C Department of neurosurgery

## 2022-11-15 ENCOUNTER — Encounter: Payer: Medicare Other | Admitting: Orthopedic Surgery

## 2022-11-15 ENCOUNTER — Encounter: Payer: Self-pay | Admitting: Neurosurgery

## 2022-11-15 ENCOUNTER — Ambulatory Visit (INDEPENDENT_AMBULATORY_CARE_PROVIDER_SITE_OTHER): Payer: Medicare Other | Admitting: Neurosurgery

## 2022-11-15 VITALS — BP 125/72 | Temp 98.5°F | Ht 72.0 in | Wt 276.0 lb

## 2022-11-15 DIAGNOSIS — M4316 Spondylolisthesis, lumbar region: Secondary | ICD-10-CM

## 2022-11-15 DIAGNOSIS — Z981 Arthrodesis status: Secondary | ICD-10-CM

## 2022-11-15 DIAGNOSIS — Z09 Encounter for follow-up examination after completed treatment for conditions other than malignant neoplasm: Secondary | ICD-10-CM

## 2022-11-15 MED ORDER — OXYCODONE HCL 5 MG PO TABS: 5.0000 mg | ORAL_TABLET | Freq: Four times a day (QID) | ORAL | 0 refills | Status: AC | PRN

## 2022-11-16 DIAGNOSIS — I10 Essential (primary) hypertension: Secondary | ICD-10-CM | POA: Diagnosis not present

## 2022-11-16 DIAGNOSIS — Z4789 Encounter for other orthopedic aftercare: Secondary | ICD-10-CM | POA: Diagnosis not present

## 2022-11-16 DIAGNOSIS — Z981 Arthrodesis status: Secondary | ICD-10-CM | POA: Diagnosis not present

## 2022-11-16 DIAGNOSIS — E785 Hyperlipidemia, unspecified: Secondary | ICD-10-CM | POA: Diagnosis not present

## 2022-11-16 DIAGNOSIS — Z79891 Long term (current) use of opiate analgesic: Secondary | ICD-10-CM | POA: Diagnosis not present

## 2022-11-24 ENCOUNTER — Other Ambulatory Visit: Payer: Self-pay | Admitting: Neurosurgery

## 2022-11-26 ENCOUNTER — Other Ambulatory Visit: Payer: Self-pay

## 2022-11-26 DIAGNOSIS — M4316 Spondylolisthesis, lumbar region: Secondary | ICD-10-CM

## 2022-12-04 ENCOUNTER — Encounter: Payer: Self-pay | Admitting: Neurosurgery

## 2022-12-04 ENCOUNTER — Ambulatory Visit
Admission: RE | Admit: 2022-12-04 | Discharge: 2022-12-04 | Disposition: A | Discharge status: Home / Self Care | Payer: Medicare Other | Attending: Neurosurgery | Admitting: Neurosurgery

## 2022-12-04 ENCOUNTER — Ambulatory Visit: Payer: Medicare Other | Admitting: Neurosurgery

## 2022-12-04 ENCOUNTER — Ambulatory Visit
Admission: RE | Admit: 2022-12-04 | Discharge: 2022-12-04 | Disposition: A | Discharge status: Home / Self Care | Payer: Medicare Other | Source: Ambulatory Visit | Attending: Neurosurgery | Admitting: Neurosurgery

## 2022-12-04 VITALS — BP 152/94 | Temp 98.8°F | Ht 70.0 in | Wt 278.0 lb

## 2022-12-04 DIAGNOSIS — M4316 Spondylolisthesis, lumbar region: Secondary | ICD-10-CM

## 2022-12-04 DIAGNOSIS — Z981 Arthrodesis status: Secondary | ICD-10-CM | POA: Diagnosis not present

## 2022-12-04 DIAGNOSIS — Z09 Encounter for follow-up examination after completed treatment for conditions other than malignant neoplasm: Secondary | ICD-10-CM

## 2022-12-04 NOTE — Progress Notes (Signed)
   REFERRING PHYSICIAN:  Duanne Limerick, Md [address] [place],  [place] [address]  DOS: 10/29/22 L4-5 XLIF/PSF  HISTORY OF PRESENT ILLNESS: Jobany R Fedak is status post XLIF.   He is doing very well.  He has some numbness on his right anterior thigh, but his pain overall is much improved.  He is walking up to a mile.  PHYSICAL EXAMINATION:  General: Patient is well developed, well nourished, calm, collected, and in no apparent distress.   NEUROLOGICAL:  General: In no acute distress.   Awake, alert, oriented to person, place, and time.  Pupils equal round and reactive to light.    Strength:            Side Iliopsoas Quads Hamstring PF DF EHL  R 4+ 5 5 5 5 5   L 5 5 5 5 5 5    Incisions c/d/I and healing well   ROS (Neurologic):  Negative except as noted above  IMAGING: No complications noted  ASSESSMENT/PLAN:  Moustapha R Dezeeuw is doing relatively well after XLIF.    I am very pleased with his improvements.  I think he will continue to improve.  Will see him back in approximately 6 weeks.  We reviewed his activity limitations.  Venetia Night MD Department of neurosurgery

## 2022-12-14 ENCOUNTER — Ambulatory Visit (INDEPENDENT_AMBULATORY_CARE_PROVIDER_SITE_OTHER): Payer: Medicare Other | Admitting: Family Medicine

## 2022-12-14 ENCOUNTER — Encounter: Payer: Self-pay | Admitting: Family Medicine

## 2022-12-14 VITALS — BP 118/78 | HR 81 | Ht 70.0 in | Wt 276.0 lb

## 2022-12-14 DIAGNOSIS — E782 Mixed hyperlipidemia: Secondary | ICD-10-CM

## 2022-12-14 DIAGNOSIS — I10 Essential (primary) hypertension: Secondary | ICD-10-CM | POA: Diagnosis not present

## 2022-12-14 MED ORDER — BENAZEPRIL HCL 40 MG PO TABS
40.0000 mg | ORAL_TABLET | Freq: Every day | ORAL | 1 refills | Status: DC
Start: 2022-12-14 — End: 2023-06-17

## 2022-12-14 MED ORDER — ROSUVASTATIN CALCIUM 10 MG PO TABS
10.0000 mg | ORAL_TABLET | Freq: Every day | ORAL | 1 refills | Status: DC
Start: 2022-12-14 — End: 2023-06-17
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
## 2022-12-17 ENCOUNTER — Other Ambulatory Visit: Payer: Self-pay

## 2022-12-17 ENCOUNTER — Telehealth: Payer: Self-pay | Admitting: Family Medicine

## 2022-12-17 DIAGNOSIS — E782 Mixed hyperlipidemia: Secondary | ICD-10-CM

## 2022-12-17 MED ORDER — FENOFIBRATE 145 MG PO TABS
145.0000 mg | ORAL_TABLET | Freq: Every day | ORAL | 1 refills | Status: DC
Start: 2022-12-17 — End: 2023-06-13

## 2022-12-17 NOTE — Telephone Encounter (Signed)
Copied from CRM [id number]. Topic: General - Other >> Dec 17, 2022  8:58 AM Turkey B wrote: Reason for CRM: pt called in says missed call form Delice Bison about his lab results. Please call back

## 2023-01-11 ENCOUNTER — Other Ambulatory Visit: Payer: Self-pay | Admitting: Family Medicine

## 2023-01-11 DIAGNOSIS — M4316 Spondylolisthesis, lumbar region: Secondary | ICD-10-CM

## 2023-01-18 NOTE — Progress Notes (Unsigned)
   REFERRING PHYSICIAN:  Duanne Limerick, Md [address] [place],  [place] [address]  DOS: 10/29/22 L4-5 XLIF/PSF  HISTORY OF PRESENT ILLNESS: Ralph Giles was doing very well at his last visit. Pain much better and he had some numbness on right anterior thigh.   He is here for follow up.   He has some right anterior/lateral thigh pain that is worse with prolonged sitting. He still has some low back pain/soreness. He is wearing his brace prn. He still has difficulty walking longer distances.    PHYSICAL EXAMINATION:  General: Patient is well developed, well nourished, calm, collected, and in no apparent distress.   NEUROLOGICAL:  General: In no acute distress.   Awake, alert, oriented to person, place, and time.  Pupils equal round and reactive to light.    Strength:            Side Iliopsoas Quads Hamstring PF DF EHL  R 5 5 5 5 5 5   L 5 5 5 5 5 5    Incisions well healed   ROS (Neurologic):  Negative except as noted above  IMAGING: Lumbar xrays dated 01/22/23:  No complications noted.  Radiology report for above xrays not yet available.   ASSESSMENT/PLAN:  [name] is doing relatively well after XLIF. He continues with some right anterior/lateral thigh pain.  Treatment options reviewed with patient and following plan made:   - Trial of neurontin for his right leg pain. Will do 300mg  at night x 5 days, the do bid. Can increase more if needed. Reviewed dosing and side effects. He will let me know how he's doing.  - Right leg pain should continue to improve with time as well. Reviewed with Dr. Myer Haff.  - He has been taking prn OTC naproxen for last month. He will change to prn OTC tylenol.  - Was given form for handicapped placard.   - Follow up in 3 months for recheck with Dr. Myer Haff. Will need xrays prior.    Drake Leach PA-C Department of neurosurgery

## 2023-01-22 ENCOUNTER — Ambulatory Visit
Admission: RE | Admit: 2023-01-22 | Discharge: 2023-01-22 | Disposition: A | Discharge status: Home / Self Care | Payer: Medicare Other | Attending: Neurosurgery | Admitting: Neurosurgery

## 2023-01-22 ENCOUNTER — Ambulatory Visit
Admission: RE | Admit: 2023-01-22 | Discharge: 2023-01-22 | Disposition: A | Discharge status: Home / Self Care | Payer: Medicare Other | Source: Ambulatory Visit | Attending: Neurosurgery | Admitting: Neurosurgery

## 2023-01-22 ENCOUNTER — Encounter: Payer: Self-pay | Admitting: Orthopedic Surgery

## 2023-01-22 ENCOUNTER — Ambulatory Visit (INDEPENDENT_AMBULATORY_CARE_PROVIDER_SITE_OTHER): Payer: Medicare Other | Admitting: Orthopedic Surgery

## 2023-01-22 VITALS — BP 128/78 | Temp 98.1°F | Ht 70.0 in | Wt 276.0 lb

## 2023-01-22 DIAGNOSIS — M5126 Other intervertebral disc displacement, lumbar region: Secondary | ICD-10-CM | POA: Diagnosis not present

## 2023-01-22 DIAGNOSIS — Z981 Arthrodesis status: Secondary | ICD-10-CM | POA: Diagnosis not present

## 2023-01-22 DIAGNOSIS — M47815 Spondylosis without myelopathy or radiculopathy, thoracolumbar region: Secondary | ICD-10-CM | POA: Diagnosis not present

## 2023-01-22 DIAGNOSIS — M4316 Spondylolisthesis, lumbar region: Secondary | ICD-10-CM

## 2023-01-22 DIAGNOSIS — Z09 Encounter for follow-up examination after completed treatment for conditions other than malignant neoplasm: Secondary | ICD-10-CM

## 2023-01-22 DIAGNOSIS — M48061 Spinal stenosis, lumbar region without neurogenic claudication: Secondary | ICD-10-CM | POA: Diagnosis not present

## 2023-01-22 DIAGNOSIS — M5416 Radiculopathy, lumbar region: Secondary | ICD-10-CM

## 2023-01-22 MED ORDER — GABAPENTIN 300 MG PO CAPS
ORAL_CAPSULE | ORAL | 1 refills | Status: DC
Start: 2023-01-22 — End: 2023-09-13

## 2023-03-06 DIAGNOSIS — G8929 Other chronic pain: Secondary | ICD-10-CM | POA: Diagnosis not present

## 2023-03-06 DIAGNOSIS — M170 Bilateral primary osteoarthritis of knee: Secondary | ICD-10-CM | POA: Diagnosis not present

## 2023-03-26 DIAGNOSIS — M170 Bilateral primary osteoarthritis of knee: Secondary | ICD-10-CM | POA: Diagnosis not present

## 2023-04-03 DIAGNOSIS — M170 Bilateral primary osteoarthritis of knee: Secondary | ICD-10-CM | POA: Diagnosis not present

## 2023-04-09 DIAGNOSIS — M216X2 Other acquired deformities of left foot: Secondary | ICD-10-CM | POA: Diagnosis not present

## 2023-04-09 DIAGNOSIS — M25572 Pain in left ankle and joints of left foot: Secondary | ICD-10-CM | POA: Diagnosis not present

## 2023-04-09 DIAGNOSIS — M25372 Other instability, left ankle: Secondary | ICD-10-CM | POA: Diagnosis not present

## 2023-04-22 ENCOUNTER — Other Ambulatory Visit: Payer: Self-pay

## 2023-04-22 DIAGNOSIS — M4316 Spondylolisthesis, lumbar region: Secondary | ICD-10-CM

## 2023-04-23 ENCOUNTER — Ambulatory Visit: Payer: Medicare Other | Admitting: Neurosurgery

## 2023-05-09 ENCOUNTER — Encounter: Payer: Self-pay | Admitting: Neurosurgery

## 2023-05-09 ENCOUNTER — Ambulatory Visit
Admission: RE | Admit: 2023-05-09 | Discharge: 2023-05-09 | Disposition: A | Discharge status: Home / Self Care | Payer: Medicare Other | Source: Ambulatory Visit | Attending: Neurosurgery | Admitting: Neurosurgery

## 2023-05-09 ENCOUNTER — Ambulatory Visit: Payer: Medicare Other | Admitting: Neurosurgery

## 2023-05-09 VITALS — BP 152/90 | Ht 70.0 in | Wt 276.0 lb

## 2023-05-09 DIAGNOSIS — I700 Atherosclerosis of aorta: Secondary | ICD-10-CM | POA: Diagnosis not present

## 2023-05-09 DIAGNOSIS — M4316 Spondylolisthesis, lumbar region: Secondary | ICD-10-CM

## 2023-05-09 DIAGNOSIS — Z981 Arthrodesis status: Secondary | ICD-10-CM | POA: Diagnosis not present

## 2023-05-09 DIAGNOSIS — Z4789 Encounter for other orthopedic aftercare: Secondary | ICD-10-CM | POA: Diagnosis not present

## 2023-05-09 NOTE — Progress Notes (Signed)
   REFERRING PHYSICIAN:  No referring provider defined for this encounter.  DOS: 10/29/22 L4-5 XLIF/PSF  HISTORY OF PRESENT ILLNESS: Ralph Giles is doing very well.  His back feels better.  He no longer has the shooting pain down his right leg.  He has occasional aches and pains.  His left knee is giving him some trouble.  He is seeing Dr. Hooten for this and Ralph Giles.    PHYSICAL EXAMINATION:  General: Patient is well developed, well nourished, calm, collected, and in no apparent distress.   NEUROLOGICAL:  General: In no acute distress.   Awake, alert, oriented to person, place, and time.  Pupils equal round and reactive to light.    Strength:            Side Iliopsoas Quads Hamstring PF DF EHL  R 5 5 5 5 5 5   L 5 5 5 5 5 5    Incisions well healed   ROS (Neurologic):  Negative except as noted above  IMAGING: Lumbar xrays dated 01/22/23 and 05/09/23:  No complications noted.   ASSESSMENT/PLAN:  Ralph Giles is doing well after XLIF.   I will defer management of his knee and ankle conditions to Drs. Fowler and Hooten.  I will release him from clinic.  I think he is doing very well from his back.  I will see him back on an as-needed basis.  I spent a total of 10 minutes in this patient's care today. This time was spent reviewing pertinent records including imaging studies, obtaining and confirming history, performing a directed evaluation, formulating and discussing my recommendations, and documenting the visit within the medical record.   Ralph Daisy MD Department of neurosurgery

## 2023-05-21 DIAGNOSIS — M170 Bilateral primary osteoarthritis of knee: Secondary | ICD-10-CM | POA: Diagnosis not present

## 2023-05-28 ENCOUNTER — Telehealth: Payer: Self-pay | Admitting: Family Medicine

## 2023-05-28 NOTE — Telephone Encounter (Signed)
Pt states he will think about if for next year, but it is a no for this year.   (AWV)

## 2023-05-28 NOTE — Telephone Encounter (Signed)
Copied from CRM [id number]. Topic: Medicare AWV >> May 28, 2023  1:38 PM Payton Doughty wrote: Reason for CRM: Called LVM 05/28/2023 to schedule AWV. Please schedule office or virtual visits.  Verlee Rossetti; Care Guide Ambulatory Clinical Support Donna l [facility] Direct Dial: [phone]

## 2023-06-04 DIAGNOSIS — M1712 Unilateral primary osteoarthritis, left knee: Secondary | ICD-10-CM | POA: Diagnosis not present

## 2023-06-09 NOTE — Discharge Instructions (Signed)
 Instructions after Total Knee Replacement   Ralph Giles P. Angie Fava., M.D.    Dept. of Orthopaedics & Sports Medicine [facility] [address] [place], [place]  [address]  Phone: [phone]   Fax: [phone]       [email or web address]       DIET: Drink plenty of non-alcoholic fluids. Resume your normal diet. Include foods high in fiber.  ACTIVITY:  You may use crutches or a walker with weight-bearing as tolerated, unless instructed otherwise. You may be weaned off of the walker or crutches by your Physical Therapist.  Do NOT place pillows under the knee. Anything placed under the knee could limit your ability to straighten the knee.   Continue doing gentle exercises. Exercising will reduce the pain and swelling, increase motion, and prevent muscle weakness.   Please continue to use the TED compression stockings for 6 weeks. You may remove the stockings at night, but should reapply them in the morning. Do not drive or operate any equipment until instructed.  WOUND CARE:  Continue to use the PolarCare or ice packs periodically to reduce pain and swelling. You may bathe or shower after the staples are removed at the first office visit following surgery.  MEDICATIONS: You may resume your regular medications. Please take the pain medication as prescribed on the medication. Do not take pain medication on an empty stomach. You have been given a prescription for a blood thinner (Lovenox or Coumadin). Please take the medication as instructed. (NOTE: After completing a 2 week course of Lovenox, take one Enteric-coated aspirin once a day. This along with elevation will help reduce the possibility of phlebitis in your operated leg.) Do not drive or drink alcoholic beverages when taking pain medications.  CALL THE OFFICE FOR: Temperature above 101 degrees Excessive bleeding or drainage on the dressing. Excessive swelling, coldness, or paleness of the toes. Persistent nausea and  vomiting.  FOLLOW-UP:  You should have an appointment to return to the office in 10-14 days after surgery. Arrangements have been made for continuation of Physical Therapy (either home therapy or outpatient therapy).     [facility] Department Directory         [email or web address]       [email or web address]          Cardiology  Appointments: [place] - [phone]  Endocrinology  Appointments: [place] [phone] [place] - [phone]  Gastroenterology  Appointments: [place] [phone] [place] - [phone]        General Surgery   Appointments: [facility]  Internal Medicine/Family Medicine  Appointments: [facility] - [phone] [place] - [phone]  Metabolic and Weigh Loss Surgery  Appointments: [facility]        Neurology  Appointments: [place] [phone] [place] - [phone]  Neurosurgery  Appointments: [place]  Obstetrics & Gynecology  Appointments: [place] [phone] [place] - [phone]        Pediatrics  Appointments: [place] [phone] [place] - [phone]  Physiatry  Appointments: [employer] [phone]  Physical Therapy  Appointments: [place] - [phone]        Podiatry  Appointments: [place] [phone] [place] - [phone]  Pulmonology  Appointments: [place]  Rheumatology  Appointments: [place] [phone]        [place] Location: [facility]  [address] [place], [place]  [address]  [place] Location: [facility] [address] [place], [place]  [address]  [place] Location: [facility] [address]  [address] [place], [place]  [address]

## 2023-06-13 ENCOUNTER — Other Ambulatory Visit: Payer: Self-pay | Admitting: Family Medicine

## 2023-06-13 DIAGNOSIS — E782 Mixed hyperlipidemia: Secondary | ICD-10-CM

## 2023-06-14 ENCOUNTER — Encounter
Admission: RE | Admit: 2023-06-14 | Discharge: 2023-06-14 | Disposition: A | Discharge status: Home / Self Care | Payer: Medicare Other | Source: Ambulatory Visit | Attending: Orthopedic Surgery | Admitting: Orthopedic Surgery

## 2023-06-14 VITALS — BP 148/81 | HR 80 | Resp 14 | Ht 70.0 in | Wt 291.0 lb

## 2023-06-14 DIAGNOSIS — Z01810 Encounter for preprocedural cardiovascular examination: Secondary | ICD-10-CM | POA: Diagnosis not present

## 2023-06-14 DIAGNOSIS — Z01818 Encounter for other preprocedural examination: Secondary | ICD-10-CM | POA: Insufficient documentation

## 2023-06-14 DIAGNOSIS — E8881 Metabolic syndrome: Secondary | ICD-10-CM | POA: Insufficient documentation

## 2023-06-14 DIAGNOSIS — M1712 Unilateral primary osteoarthritis, left knee: Secondary | ICD-10-CM | POA: Diagnosis not present

## 2023-06-14 HISTORY — DX: Unilateral primary osteoarthritis, left knee: M17.12

## 2023-06-14 HISTORY — DX: Rectal polyp: K62.1

## 2023-06-14 HISTORY — DX: Arthrodesis status: Z98.1

## 2023-06-14 HISTORY — DX: Benign neoplasm of transverse colon: D12.3

## 2023-06-14 LAB — COMPREHENSIVE METABOLIC PANEL
ALT: 21 U/L (ref 0–44)
AST: 18 U/L (ref 15–41)
Albumin: 4 g/dL (ref 3.5–5.0)
Alkaline Phosphatase: 46 U/L (ref 38–126)
Anion gap: 8 (ref 5–15)
BUN: 24 mg/dL — ABNORMAL HIGH (ref 8–23)
CO2: 25 mmol/L (ref 22–32)
Calcium: 9.6 mg/dL (ref 8.9–10.3)
Chloride: 106 mmol/L (ref 98–111)
Creatinine, Ser: 0.97 mg/dL (ref 0.61–1.24)
GFR, Estimated: >60 mL/min (ref >60)
Glucose, Bld: 73 mg/dL (ref 70–99)
Potassium: 4.3 mmol/L (ref 3.5–5.1)
Sodium: 139 mmol/L (ref 135–145)
Total Bilirubin: 0.5 mg/dL (ref 0.0–1.2)
Total Protein: 7.2 g/dL (ref 6.5–8.1)

## 2023-06-14 LAB — CBC
HCT: 46.1 % (ref 39.0–52.0)
Hemoglobin: 15.5 g/dL (ref 13.0–17.0)
MCH: 32 pg (ref 26.0–34.0)
MCHC: 33.6 g/dL (ref 30.0–36.0)
MCV: 95.2 fL (ref 80.0–100.0)
Platelets: 273 10*3/uL (ref 150–400)
RBC: 4.84 MIL/uL (ref 4.22–5.81)
RDW: 12.2 % (ref 11.5–15.5)
WBC: 7.7 10*3/uL (ref 4.0–10.5)
nRBC: 0 % (ref 0.0–0.2)

## 2023-06-14 LAB — SEDIMENTATION RATE: Sed Rate: 4 mm/h (ref 0–20)

## 2023-06-14 LAB — URINALYSIS, ROUTINE W REFLEX MICROSCOPIC
Bacteria, UA: NONE SEEN
Bilirubin Urine: NEGATIVE
Glucose, UA: NEGATIVE mg/dL
Ketones, ur: NEGATIVE mg/dL
Leukocytes,Ua: NEGATIVE
Nitrite: NEGATIVE
Protein, ur: NEGATIVE mg/dL
RBC / HPF: 0 RBC/hpf (ref 0–5)
Specific Gravity, Urine: 1.021 (ref 1.005–1.030)
pH: 5 (ref 5.0–8.0)

## 2023-06-14 LAB — SURGICAL PCR SCREEN
MRSA, PCR: NEGATIVE
Staphylococcus aureus: POSITIVE — AB

## 2023-06-14 LAB — C-REACTIVE PROTEIN: CRP: <0.5 mg/dL (ref <1.0)

## 2023-06-14 NOTE — Patient Instructions (Addendum)
 Your procedure is scheduled on:06-21-23 Friday Report to the Registration Desk on the 1st floor of the Medical Mall.Then proceed to the 2nd floor Surgery Desk To find out your arrival time, please call [phone] between 1PM - 3PM on:06-20-23 Thursday If your arrival time is 6:00 am, do not arrive before that time as the Medical Mall entrance doors do not open until 6:00 am.  REMEMBER: Instructions that are not followed completely may result in serious medical risk, up to and including death; or upon the discretion of your surgeon and anesthesiologist your surgery may need to be rescheduled.  Do not eat food after midnight the night before surgery.  No gum chewing or hard candies.  You may however, drink CLEAR liquids up to 2 hours before you are scheduled to arrive for your surgery. Do not drink anything within 2 hours of your scheduled arrival time.  Clear liquids include: - water  - apple juice without pulp - gatorade (not RED colors) - black coffee or tea (Do NOT add milk or creamers to the coffee or tea) Do NOT drink anything that is not on this list.  In addition, your doctor has ordered for you to drink the provided:  Ensure Pre-Surgery Clear Carbohydrate Drink  Drinking this carbohydrate drink up to two hours before surgery helps to reduce insulin resistance and improve patient outcomes. Please complete drinking 2 hours before scheduled arrival time.  One week prior to surgery:Stop NOW (06-14-23) Stop Anti-inflammatories (NSAIDS) such as Advil, Aleve, Ibuprofen, Motrin, Naproxen, Naprosyn and Aspirin  based products such as Excedrin, Goody's Powder, BC Powder. Stop ANY OVER THE COUNTER supplements until after surgery (Fish Oil )  You may however, continue to take Tylenol  if needed for pain up until the day of surgery  Continue taking all of your other prescription medications up until the day of surgery.  ON THE DAY OF SURGERY ONLY TAKE THESE MEDICATIONS WITH SIPS OF  WATER: -gabapentin  (NEURONTIN )   No Alcohol for 24 hours before or after surgery.  No Smoking including e-cigarettes for 24 hours before surgery.  No chewable tobacco products for at least 6 hours before surgery.  No nicotine patches on the day of surgery.  Do not use any recreational drugs for at least a week (preferably 2 weeks) before your surgery.  Please be advised that the combination of cocaine and anesthesia may have negative outcomes, up to and including death. If you test positive for cocaine, your surgery will be cancelled.  On the morning of surgery brush your teeth with toothpaste and water, you may rinse your mouth with mouthwash if you wish. Do not swallow any toothpaste or mouthwash.  Use CHG Soap as directed on instruction sheet.  Do not wear jewelry, make-up, hairpins, clips or nail polish.  For welded (permanent) jewelry: bracelets, anklets, waist bands, etc.  Please have this removed prior to surgery.  If it is not removed, there is a chance that hospital personnel will need to cut it off on the day of surgery.  Do not wear lotions, powders, or perfumes.   Do not shave body hair from the neck down 48 hours before surgery.  Contact lenses, hearing aids and dentures may not be worn into surgery.  Do not bring valuables to the hospital. [facility] is not responsible for any missing/lost belongings or valuables.  Notify your doctor if there is any change in your medical condition (cold, fever, infection).  Wear comfortable clothing (specific to your surgery type) to the hospital.  After surgery, you can help prevent lung complications by doing breathing exercises.  Take deep breaths and cough every 1-2 hours. Your doctor may order a device called an Incentive Spirometer to help you take deep breaths. When coughing or sneezing, hold a pillow firmly against your incision with both hands. This is called "splinting." Doing this helps protect your incision. It also  decreases belly discomfort.  If you are being admitted to the hospital overnight, leave your suitcase in the car. After surgery it may be brought to your room.  In case of increased patient census, it may be necessary for you, the patient, to continue your postoperative care in the Same Day Surgery department.  If you are being discharged the day of surgery, you will not be allowed to drive home. You will need a responsible individual to drive you home and stay with you for 24 hours after surgery.   If you are taking public transportation, you will need to have a responsible individual with you.  Please call the Pre-admissions Testing Dept. at [phone] if you have any questions about these instructions.  Surgery Visitation Policy:  Patients having surgery or a procedure may have two visitors.  Children under the age of 68 must have an adult with them who is not the patient.  Temporary Visitor Restrictions Due to increasing cases of flu, RSV and COVID-19: Children ages 42 and under will not be able to visit patients in [facility] hospitals under most circumstances.  Inpatient Visitation:    Visiting hours are 7 a.m. to 8 p.m. Up to four visitors are allowed at one time in a patient room. The visitors may rotate out with other people during the day.  One visitor age 10 or older may stay with the patient overnight and must be in the room by 8 p.m.    Pre-operative 5 CHG Bath Instructions   You can play a key role in reducing the risk of infection after surgery. Your skin needs to be as free of germs as possible. You can reduce the number of germs on your skin by washing with CHG (chlorhexidine  gluconate) soap before surgery. CHG is an antiseptic soap that kills germs and continues to kill germs even after washing.   DO NOT use if you have an allergy to chlorhexidine /CHG or antibacterial soaps. If your skin becomes reddened or irritated, stop using the CHG and notify one of our  RNs at [phone].   Please shower with the CHG soap starting 4 days before surgery using the following schedule:     Please keep in mind the following:  DO NOT shave, including legs and underarms, starting the day of your first shower.   You may shave your face at any point before/day of surgery.  Place clean sheets on your bed the day you start using CHG soap. Use a clean washcloth (not used since being washed) for each shower. DO NOT sleep with pets once you start using the CHG.   CHG Shower Instructions:  If you choose to wash your hair and private area, wash first with your normal shampoo/soap.  After you use shampoo/soap, rinse your hair and body thoroughly to remove shampoo/soap residue.  Turn the water OFF and apply about 3 tablespoons (45 ml) of CHG soap to a CLEAN washcloth.  Apply CHG soap ONLY FROM YOUR NECK DOWN TO YOUR TOES (washing for 3-5 minutes)  DO NOT use CHG soap on face, private areas, open wounds, or sores.  Pay special attention to the area where your surgery is being performed.  If you are having back surgery, having someone wash your back for you may be helpful. Wait 2 minutes after CHG soap is applied, then you may rinse off the CHG soap.  Pat dry with a clean towel  Put on clean clothes/pajamas   If you choose to wear lotion, please use ONLY the CHG-compatible lotions on the back of this paper.     Additional instructions for the day of surgery: DO NOT APPLY any lotions, deodorants, cologne, or perfumes.   Put on clean/comfortable clothes.  Brush your teeth.  Ask your nurse before applying any prescription medications to the skin.      CHG Compatible Lotions   Aveeno Moisturizing lotion  Cetaphil Moisturizing Cream  Cetaphil Moisturizing Lotion  Clairol Herbal Essence Moisturizing Lotion, Dry Skin  Clairol Herbal Essence Moisturizing Lotion, Extra Dry Skin  Clairol Herbal Essence Moisturizing Lotion, Normal Skin  Curel Age Defying Therapeutic  Moisturizing Lotion with Alpha Hydroxy  Curel Extreme Care Body Lotion  Curel Soothing Hands Moisturizing Hand Lotion  Curel Therapeutic Moisturizing Cream, Fragrance-Free  Curel Therapeutic Moisturizing Lotion, Fragrance-Free  Curel Therapeutic Moisturizing Lotion, Original Formula  Eucerin Daily Replenishing Lotion  Eucerin Dry Skin Therapy Plus Alpha Hydroxy Crme  Eucerin Dry Skin Therapy Plus Alpha Hydroxy Lotion  Eucerin Original Crme  Eucerin Original Lotion  Eucerin Plus Crme Eucerin Plus Lotion  Eucerin TriLipid Replenishing Lotion  Keri Anti-Bacterial Hand Lotion  Keri Deep Conditioning Original Lotion Dry Skin Formula Softly Scented  Keri Deep Conditioning Original Lotion, Fragrance Free Sensitive Skin Formula  Keri Lotion Fast Absorbing Fragrance Free Sensitive Skin Formula  Keri Lotion Fast Absorbing Softly Scented Dry Skin Formula  Keri Original Lotion  Keri Skin Renewal Lotion Keri Silky Smooth Lotion  Keri Silky Smooth Sensitive Skin Lotion  Nivea Body Creamy Conditioning Oil  Nivea Body Extra Enriched Lotion  Nivea Body Original Lotion  Nivea Body Sheer Moisturizing Lotion Nivea Crme  Nivea Skin Firming Lotion  NutraDerm 30 Skin Lotion  NutraDerm Skin Lotion  NutraDerm Therapeutic Skin Cream  NutraDerm Therapeutic Skin Lotion  ProShield Protective Hand Cream  Provon moisturizing lotion  How to Use an Incentive Spirometer An incentive spirometer is a tool that measures how well you are filling your lungs with each breath. Learning to take long, deep breaths using this tool can help you keep your lungs clear and active. This may help to reverse or lessen your chance of developing breathing (pulmonary) problems, especially infection. You may be asked to use a spirometer: After a surgery. If you have a lung problem or a history of smoking. After a long period of time when you have been unable to move or be active. If the spirometer includes an indicator to show  the highest number that you have reached, your health care provider or respiratory therapist will help you set a goal. Keep a log of your progress as told by your health care provider. What are the risks? Breathing too quickly may cause dizziness or cause you to pass out. Take your time so you do not get dizzy or light-headed. If you are in pain, you may need to take pain medicine before doing incentive spirometry. It is harder to take a deep breath if you are having pain. How to use your incentive spirometer  Sit up on the edge of your bed or on a chair. Hold the incentive spirometer so that it is in an  upright position. Before you use the spirometer, breathe out normally. Place the mouthpiece in your mouth. Make sure your lips are closed tightly around it. Breathe in slowly and as deeply as you can through your mouth, causing the piston or the ball to rise toward the top of the chamber. Hold your breath for 3-5 seconds, or for as long as possible. If the spirometer includes a coach indicator, use this to guide you in breathing. Slow down your breathing if the indicator goes above the marked areas. Remove the mouthpiece from your mouth and breathe out normally. The piston or ball will return to the bottom of the chamber. Rest for a few seconds, then repeat the steps 10 or more times. Take your time and take a few normal breaths between deep breaths so that you do not get dizzy or light-headed. Do this every 1-2 hours when you are awake. If the spirometer includes a goal marker to show the highest number you have reached (best effort), use this as a goal to work toward during each repetition. After each set of 10 deep breaths, cough a few times. This will help to make sure that your lungs are clear. If you have an incision on your chest or abdomen from surgery, place a pillow or a rolled-up towel firmly against the incision when you cough. This can help to reduce pain while taking deep breaths and  coughing. General tips When you are able to get out of bed: Walk around often. Continue to take deep breaths and cough in order to clear your lungs. Keep using the incentive spirometer until your health care provider says it is okay to stop using it. If you have been in the hospital, you may be told to keep using the spirometer at home. Contact a health care provider if: You are having difficulty using the spirometer. You have trouble using the spirometer as often as instructed. Your pain medicine is not giving enough relief for you to use the spirometer as told. You have a fever. Get help right away if: You develop shortness of breath. You develop a cough with bloody mucus from the lungs. You have fluid or blood coming from an incision site after you cough. Summary An incentive spirometer is a tool that can help you learn to take long, deep breaths to keep your lungs clear and active. You may be asked to use a spirometer after a surgery, if you have a lung problem or a history of smoking, or if you have been inactive for a long period of time. Use your incentive spirometer as instructed every 1-2 hours while you are awake. If you have an incision on your chest or abdomen, place a pillow or a rolled-up towel firmly against your incision when you cough. This will help to reduce pain. Get help right away if you have shortness of breath, you cough up bloody mucus, or blood comes from your incision when you cough. This information is not intended to replace advice given to you by your health care provider. Make sure you discuss any questions you have with your health care provider. Document Revised: 03/01/2023 Document Reviewed: 03/01/2023 Elsevier Patient Education  2024 [employer]  Preoperative Educational Videos for Total Hip, Knee and Shoulder Replacements  To better prepare for surgery, please view our videos that explain the physical activity and discharge planning required to have  the best surgical recovery at [facility].  [email or web address]  Questions? Call [phone] or email [email or web address]

## 2023-06-16 NOTE — H&P (Signed)
 ORTHOPAEDIC HISTORY & PHYSICAL Ralph Giles, Ralph Giles., MD - 06/04/2023 4:00 PM EST Formatting of this note is different from the original. Images from the original note were not included. Chief Complaint: Chief Complaint Patient presents with Bilateral knee degenerative arthrosis  Reason for Visit: The patient is a 68 y.o. male who presents today with his wife for reevaluation of his left knee. He has a long history of left knee pain. He localizes most of the pain along the medial aspect of the knee. He reports some swelling, no locking, and some giving way of the knee. The pain is aggravated by any weight bearing. The knee pain limits the patient's ability to ambulate long distances. The patient has not appreciated any significant improvement despite Tylenol , intraarticular corticosteroid injections, viscosupplementation, and activity modification. He is not using any ambulatory aids. The patient states that the knee pain has progressed to the point that it is significantly interfering with his activities of daily living.  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 He tends to walk on the lateral aspect of his foot. He has been evaluated by Dr. Ashley (Podiatry) and has been using an ASO brace.  Medications: Current Outpatient Medications Medication Sig Dispense Refill benazepril  (LOTENSIN ) 40 MG tablet Take 40 mg by mouth once daily. docosahexaenoic acid-epa 120-180 mg Cap Take 1,000 mg by mouth once daily gabapentin  (NEURONTIN ) 300 MG capsule Take 1 capsule (300 mg total) by mouth at bedtime 1 po qHS x 4 days, then bid x 4 days, then tid 90 capsule 1 naproxen sodium (ALEVE) 220 MG tablet Take 440 mg by mouth once daily as needed for Pain rosuvastatin  (CRESTOR ) 10 MG tablet Take 1 tablet by mouth once daily  No current facility-administered medications for this visit.  Allergies: Allergies Allergen Reactions Amlodipine Swelling Feet  swelling  Past Medical History: Past Medical History: Diagnosis Date Hypertension  Past Surgical History: Past Surgical History: Procedure Laterality Date Left total hip arthroplasty 08/08/2011 Dr Mardee Right knee arthroscopy, partial medial meniscectomy and chondroplasty of the medial, lateral and patellofemoral compartments. 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[employer], Delivery [employer] Tobacco Use Smoking status: Never Smokeless tobacco: Never Vaping Use Vaping status: Never Used Substance and Sexual Activity Alcohol use: Yes Alcohol/week: 2.0 standard drinks of alcohol Types: 2 Cans of beer per week Drug use: No Sexual activity: Yes Partners: Female  Social Drivers of Catering Manager Strain: Low Risk (04/09/2023) Overall Financial Resource Strain (CARDIA) Difficulty of Paying Living Expenses: Not hard at all Food Insecurity: No Food Insecurity (04/09/2023) Hunger Vital Sign Worried About Running Out of Food in the Last Year: Never true Ran Out of Food in the Last Year: Never true Transportation Needs: No Transportation Needs (04/09/2023) PRAPARE - Contractor (Medical): No Lack of Transportation (Non-Medical): No Housing Stability: Low Risk (06/04/2023) Housing Stability Vital Sign Unable to Pay for Housing in the Last Year: No Number of Times Moved in the Last Year: 0 Homeless in the Last Year: No  Family History: Family History Problem Relation Name Age of Onset Breast cancer Sister High blood pressure (Hypertension) Mother High blood pressure (Hypertension) Father No Known Problems Brother  Review of Systems: A comprehensive 14 point ROS was performed, reviewed, and the pertinent  orthopaedic  findings are documented in the HPI.  Exam BP (!) 148/84  Ht 177.8 cm (5' 10)  Wt (!) 131.7 kg (290 lb 6.4 oz)  BMI 41.67 kg/m  General: Well-developed, well-nourished male seen in no acute distress. Antalgic gait. Varus thrust to the right knee.  HEENT: Atraumatic, normocephalic. Pupils are equal and reactive to light. Extraocular motion is intact. Sclera are clear. Oropharynx is clear with moist mucosa.  Neck: Supple, nontender, and with good ROM. No thyromegaly, adenopathy, JVD, or carotid bruits.  Lungs: Clear to auscultation bilaterally.  Cardiovascular: Regular rate and rhythm. Normal S1, S2. No murmur . No appreciable gallops or rubs. Peripheral pulses are palpable. No lower extremity edema. Homan`s test is negative.  Abdomen: Soft, nontender, nondistended. Bowel sounds are present.  Extremities: Good strength, stability, and range of motion of the upper extremities. Good range of motion of the hips and ankles. The left ankle is in relative varus.  Left Knee: Soft tissue swelling: minimal Effusion: none Erythema: none Crepitance: mild Tenderness: medial Alignment: relative varus Mediolateral laxity: medial pseudolaxity Posterior sag: negative Patellar tracking: Good tracking without evidence of subluxation or tilt Atrophy: No significant atrophy. Quadriceps tone was good. Range of motion: 0/2/115 degrees  Neurologic: Awake, alert, and oriented. Sensory function is intact to pinprick and light touch. Motor strength is judged to be 5/5. Motor coordination is within normal limits. No apparent clonus. No tremor.  X-rays: I reviewed the left knee radiographs that were performed at [facility] on 03/06/2023. There is narrowing of the medial cartilage space with associated varus alignment. Osteophyte formation is noted. Subchondral sclerosis is noted. No evidence of fracture or dislocation.  Impression: Degenerative arthrosis of the left  knee  Plan: The findings were discussed in detail with the patient. The patient was given informational material on total knee replacement. Conservative treatment options were reviewed with the patient. We discussed the risks and benefits of surgical intervention. The usual perioperative course was also discussed in detail. The patient expressed understanding of the risks and benefits of surgical intervention and would like to proceed with plans for left total knee arthroplasty.  I have suggested that the patient contact Dr. Ashley about a custom brace to provide more stability for the left ankle.  Body mass index is 41.67 kg/m. Obesity has been associated with hypertension, diabetes, dyslipidemia, obstructive sleep apnea, arthritis, back pain, and depression. Obesity is most commonly caused by sedentary lifestyle and increased calorie intake. The patient should increase physical activity (especially low impact exercise) and reduce dietary intake (e.g. small portion via small plate size, avoid starving yourself, avoid highly processed food). Body mass index greater than 40 has been associated with increased risk for perioperative complications including poor wound healing, surgical site infection, deep venous thrombosis, and cardiopulmonary complications. The patient needs to achieve a target weight of 279 pounds to reach a BMI of 40. Wt Readings from Last 3 Encounters: 06/04/23 (!) 131.7 kg (290 lb 6.4 oz) 05/21/23 (!) 131.9 kg (290 lb 12.8 oz) 04/09/23 (!) 129.4 kg (285 lb 4.4 oz)  I spent a total of 45 minutes in both face-to-face and non-face-to-face activities, excluding procedures performed, for this visit on the date of this encounter.  MEDICAL CLEARANCE: Per anesthesiology. ACTIVITY: As tolerated. WORK STATUS: Not applicable. THERAPY: Preoperative physical therapy evaluation. MEDICATIONS: Requested Prescriptions  No prescriptions requested or ordered in this  encounter  FOLLOW-UP: Return for preop History & Physical pending surgery date.  Esmeralda Blanford P. Lysa Livengood, Jr., M.D.  This note was  generated in part with voice recognition software and I apologize for any typographical errors that were not detected and corrected.  Electronically signed by Mardee Ralph Idalia Mickey., MD at 06/09/2023 1:25 PM EST

## 2023-06-17 ENCOUNTER — Ambulatory Visit: Payer: Medicare Other | Admitting: Family Medicine

## 2023-06-17 ENCOUNTER — Encounter: Payer: Self-pay | Admitting: Family Medicine

## 2023-06-17 VITALS — BP 152/86 | HR 83 | Ht 70.0 in | Wt 294.0 lb

## 2023-06-17 DIAGNOSIS — E782 Mixed hyperlipidemia: Secondary | ICD-10-CM | POA: Diagnosis not present

## 2023-06-17 DIAGNOSIS — I10 Essential (primary) hypertension: Secondary | ICD-10-CM | POA: Diagnosis not present

## 2023-06-17 MED ORDER — ROSUVASTATIN CALCIUM 10 MG PO TABS: 10.0000 mg | ORAL_TABLET | ORAL | 1 refills | Status: AC

## 2023-06-17 MED ORDER — HYDROCHLOROTHIAZIDE 12.5 MG PO TABS: 12.5000 mg | ORAL_TABLET | Freq: Every day | ORAL | 1 refills | Status: DC

## 2023-06-17 MED ORDER — BENAZEPRIL HCL 40 MG PO TABS: 40.0000 mg | ORAL_TABLET | ORAL | 1 refills | Status: AC
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
## 2023-06-18 DIAGNOSIS — I10 Essential (primary) hypertension: Secondary | ICD-10-CM | POA: Diagnosis not present

## 2023-06-18 DIAGNOSIS — E782 Mixed hyperlipidemia: Secondary | ICD-10-CM | POA: Diagnosis not present

## 2023-06-19 ENCOUNTER — Encounter: Payer: Self-pay | Admitting: Family Medicine

## 2023-06-19 LAB — RENAL FUNCTION PANEL
Albumin: 4.5 g/dL (ref 3.9–4.9)
BUN/Creatinine Ratio: 21 (ref 10–24)
BUN: 22 mg/dL (ref 8–27)
CO2: 23 mmol/L (ref 20–29)
Calcium: 10.1 mg/dL (ref 8.6–10.2)
Chloride: 104 mmol/L (ref 96–106)
Creatinine, Ser: 1.07 mg/dL (ref 0.76–1.27)
Glucose: 102 mg/dL — ABNORMAL HIGH (ref 70–99)
Phosphorus: 3.5 mg/dL (ref 2.8–4.1)
Potassium: 4.8 mmol/L (ref 3.5–5.2)
Sodium: 145 mmol/L — ABNORMAL HIGH (ref 134–144)
eGFR: 77 mL/min/{1.73_m2} (ref >59)

## 2023-06-19 LAB — LIPID PANEL
Chol/HDL Ratio: 3.8 {ratio} (ref 0.0–5.0)
Cholesterol, Total: 198 mg/dL (ref 100–199)
HDL: 52 mg/dL (ref >39)
LDL Chol Calc (NIH): 118 mg/dL — ABNORMAL HIGH (ref 0–99)
Triglycerides: 162 mg/dL — ABNORMAL HIGH (ref 0–149)
VLDL Cholesterol Cal: 28 mg/dL (ref 5–40)

## 2023-06-21 ENCOUNTER — Other Ambulatory Visit: Payer: Self-pay

## 2023-06-21 ENCOUNTER — Encounter
Admission: RE | Disposition: A | Discharge status: Home Health | Payer: Self-pay | Source: Home / Self Care | Attending: Orthopedic Surgery

## 2023-06-21 ENCOUNTER — Observation Stay: Payer: Medicare Other

## 2023-06-21 ENCOUNTER — Ambulatory Visit: Payer: Medicare Other | Admitting: Anesthesiology

## 2023-06-21 ENCOUNTER — Ambulatory Visit: Payer: Medicare Other | Admitting: Urgent Care

## 2023-06-21 ENCOUNTER — Observation Stay
Admission: RE | Admit: 2023-06-21 | Discharge: 2023-06-22 | Disposition: A | Discharge status: Home Health | Payer: Medicare Other | Attending: Orthopedic Surgery | Admitting: Orthopedic Surgery

## 2023-06-21 ENCOUNTER — Encounter: Payer: Self-pay | Admitting: Orthopedic Surgery

## 2023-06-21 DIAGNOSIS — N471 Phimosis: Secondary | ICD-10-CM

## 2023-06-21 DIAGNOSIS — I10 Essential (primary) hypertension: Secondary | ICD-10-CM | POA: Insufficient documentation

## 2023-06-21 DIAGNOSIS — Z96642 Presence of left artificial hip joint: Secondary | ICD-10-CM | POA: Diagnosis not present

## 2023-06-21 DIAGNOSIS — E8881 Metabolic syndrome: Secondary | ICD-10-CM

## 2023-06-21 DIAGNOSIS — Z79899 Other long term (current) drug therapy: Secondary | ICD-10-CM | POA: Insufficient documentation

## 2023-06-21 DIAGNOSIS — Z96652 Presence of left artificial knee joint: Secondary | ICD-10-CM

## 2023-06-21 DIAGNOSIS — R609 Edema, unspecified: Secondary | ICD-10-CM | POA: Diagnosis not present

## 2023-06-21 DIAGNOSIS — M1712 Unilateral primary osteoarthritis, left knee: Principal | ICD-10-CM | POA: Insufficient documentation

## 2023-06-21 HISTORY — PX: KNEE ARTHROPLASTY: SHX992

## 2023-06-21 SURGERY — ARTHROPLASTY, KNEE, TOTAL, USING IMAGELESS COMPUTER-ASSISTED NAVIGATION
Anesthesia: Spinal | Site: Knee | Laterality: Left

## 2023-06-21 MED ORDER — CEFAZOLIN SODIUM-DEXTROSE 3-4 GM/150ML-% IV SOLN
3.0000 g | INTRAVENOUS | Status: AC
  Administered 2023-06-21: 3 g via INTRAVENOUS
  Filled 2023-06-21: qty 150

## 2023-06-21 MED ORDER — MENTHOL 3 MG MT LOZG: 1.0000 | LOZENGE | OROMUCOSAL | Status: DC | PRN

## 2023-06-21 MED ORDER — FENTANYL CITRATE (PF) 100 MCG/2ML IJ SOLN
INTRAMUSCULAR | Status: AC
  Filled 2023-06-21: qty 2

## 2023-06-21 MED ORDER — HYDROMORPHONE HCL 1 MG/ML IJ SOLN
0.5000 mg | INTRAMUSCULAR | Status: DC | PRN
  Administered 2023-06-21: 1 mg via INTRAVENOUS
  Filled 2023-06-21: qty 1

## 2023-06-21 MED ORDER — FENTANYL CITRATE (PF) 100 MCG/2ML IJ SOLN: 25.0000 ug | INTRAMUSCULAR | Status: DC | PRN

## 2023-06-21 MED ORDER — METOCLOPRAMIDE HCL 5 MG PO TABS
10.0000 mg | ORAL_TABLET | Freq: Three times a day (TID) | ORAL | Status: DC
  Administered 2023-06-21 – 2023-06-22 (×3): 10 mg via ORAL
  Filled 2023-06-21 (×3): qty 2

## 2023-06-21 MED ORDER — TRAMADOL HCL 50 MG PO TABS
50.0000 mg | ORAL_TABLET | ORAL | Status: DC | PRN
  Administered 2023-06-21: 100 mg via ORAL
  Filled 2023-06-21: qty 2

## 2023-06-21 MED ORDER — ONDANSETRON HCL 4 MG/2ML IJ SOLN
INTRAMUSCULAR | Status: DC | PRN
  Administered 2023-06-21: 4 mg via INTRAVENOUS

## 2023-06-21 MED ORDER — BENAZEPRIL HCL 20 MG PO TABS
40.0000 mg | ORAL_TABLET | ORAL | Status: DC
  Administered 2023-06-22: 40 mg via ORAL
  Filled 2023-06-21: qty 2

## 2023-06-21 MED ORDER — OXYCODONE HCL 5 MG PO TABS
10.0000 mg | ORAL_TABLET | ORAL | Status: DC | PRN
  Administered 2023-06-21: 10 mg via ORAL
  Filled 2023-06-21: qty 2

## 2023-06-21 MED ORDER — PHENYLEPHRINE HCL-NACL 20-0.9 MG/250ML-% IV SOLN
INTRAVENOUS | Status: AC
  Filled 2023-06-21: qty 250

## 2023-06-21 MED ORDER — TRANEXAMIC ACID-NACL 1000-0.7 MG/100ML-% IV SOLN
INTRAVENOUS | Status: DC | PRN
  Administered 2023-06-21: 1000 mg via INTRAVENOUS

## 2023-06-21 MED ORDER — MAGNESIUM HYDROXIDE 400 MG/5ML PO SUSP
30.0000 mL | Freq: Every day | ORAL | Status: DC
  Administered 2023-06-22: 30 mL via ORAL
  Filled 2023-06-21: qty 30

## 2023-06-21 MED ORDER — FENTANYL CITRATE (PF) 100 MCG/2ML IJ SOLN
INTRAMUSCULAR | Status: DC | PRN
  Administered 2023-06-21 (×2): 50 ug via INTRAVENOUS

## 2023-06-21 MED ORDER — DEXMEDETOMIDINE HCL IN NACL 80 MCG/20ML IV SOLN
INTRAVENOUS | Status: DC | PRN
  Administered 2023-06-21 (×4): 8 ug via INTRAVENOUS

## 2023-06-21 MED ORDER — ONDANSETRON HCL 4 MG/2ML IJ SOLN: 4.0000 mg | Freq: Four times a day (QID) | INTRAMUSCULAR | Status: DC | PRN

## 2023-06-21 MED ORDER — GABAPENTIN 300 MG PO CAPS
300.0000 mg | ORAL_CAPSULE | Freq: Every morning | ORAL | Status: DC
Start: 2023-06-22 — End: 2023-06-22
  Administered 2023-06-22: 300 mg via ORAL
  Filled 2023-06-21: qty 1

## 2023-06-21 MED ORDER — PROPOFOL 500 MG/50ML IV EMUL
INTRAVENOUS | Status: DC | PRN
  Administered 2023-06-21 (×2): 20 mg via INTRAVENOUS
  Administered 2023-06-21: 50 ug/kg/min via INTRAVENOUS

## 2023-06-21 MED ORDER — BISACODYL 10 MG RE SUPP: 10.0000 mg | Freq: Every day | RECTAL | Status: DC | PRN

## 2023-06-21 MED ORDER — CELECOXIB 200 MG PO CAPS
200.0000 mg | ORAL_CAPSULE | Freq: Two times a day (BID) | ORAL | Status: DC
  Administered 2023-06-21 – 2023-06-22 (×2): 200 mg via ORAL
  Filled 2023-06-21 (×2): qty 1

## 2023-06-21 MED ORDER — MUPIROCIN 2 % EX OINT: 1.0000 | TOPICAL_OINTMENT | Freq: Two times a day (BID) | CUTANEOUS | 0 refills | Status: AC

## 2023-06-21 MED ORDER — ALUM & MAG HYDROXIDE-SIMETH 200-200-20 MG/5ML PO SUSP: 30.0000 mL | ORAL | Status: DC | PRN

## 2023-06-21 MED ORDER — CEFAZOLIN SODIUM-DEXTROSE 2-4 GM/100ML-% IV SOLN
2.0000 g | Freq: Four times a day (QID) | INTRAVENOUS | Status: AC
  Administered 2023-06-21 (×2): 2 g via INTRAVENOUS
  Filled 2023-06-21: qty 100

## 2023-06-21 MED ORDER — FENOFIBRATE 160 MG PO TABS
160.0000 mg | ORAL_TABLET | Freq: Every day | ORAL | Status: DC
Start: 2023-06-22 — End: 2023-06-22
  Administered 2023-06-22: 160 mg via ORAL
  Filled 2023-06-21: qty 1

## 2023-06-21 MED ORDER — BUPIVACAINE HCL (PF) 0.25 % IJ SOLN
INTRAMUSCULAR | Status: AC
  Filled 2023-06-21: qty 60

## 2023-06-21 MED ORDER — SODIUM CHLORIDE 0.9 % IV SOLN: INTRAVENOUS | Status: DC

## 2023-06-21 MED ORDER — TRANEXAMIC ACID-NACL 1000-0.7 MG/100ML-% IV SOLN
1000.0000 mg | Freq: Once | INTRAVENOUS | Status: AC
  Administered 2023-06-21: 1000 mg via INTRAVENOUS

## 2023-06-21 MED ORDER — CELECOXIB 200 MG PO CAPS: 400.0000 mg | ORAL_CAPSULE | Freq: Once | ORAL | Status: DC

## 2023-06-21 MED ORDER — MIDAZOLAM HCL 2 MG/2ML IJ SOLN
INTRAMUSCULAR | Status: AC
  Filled 2023-06-21: qty 2

## 2023-06-21 MED ORDER — FERROUS SULFATE 325 (65 FE) MG PO TABS
325.0000 mg | ORAL_TABLET | Freq: Two times a day (BID) | ORAL | Status: DC
  Administered 2023-06-21 – 2023-06-22 (×2): 325 mg via ORAL
  Filled 2023-06-21 (×2): qty 1

## 2023-06-21 MED ORDER — BUPIVACAINE HCL (PF) 0.5 % IJ SOLN
INTRAMUSCULAR | Status: DC | PRN
  Administered 2023-06-21: 3 mL

## 2023-06-21 MED ORDER — ASPIRIN 81 MG PO CHEW
81.0000 mg | CHEWABLE_TABLET | Freq: Two times a day (BID) | ORAL | Status: DC
  Administered 2023-06-21 – 2023-06-22 (×2): 81 mg via ORAL
  Filled 2023-06-21 (×2): qty 1

## 2023-06-21 MED ORDER — GABAPENTIN 300 MG PO CAPS
300.0000 mg | ORAL_CAPSULE | Freq: Once | ORAL | Status: AC
  Administered 2023-06-21: 300 mg via ORAL

## 2023-06-21 MED ORDER — DEXMEDETOMIDINE HCL IN NACL 80 MCG/20ML IV SOLN
INTRAVENOUS | Status: AC
  Filled 2023-06-21: qty 20

## 2023-06-21 MED ORDER — DEXAMETHASONE SODIUM PHOSPHATE 10 MG/ML IJ SOLN
8.0000 mg | Freq: Once | INTRAMUSCULAR | Status: AC
  Administered 2023-06-21: 8 mg via INTRAVENOUS

## 2023-06-21 MED ORDER — FLEET ENEMA RE ENEM: 1.0000 | ENEMA | Freq: Once | RECTAL | Status: DC | PRN

## 2023-06-21 MED ORDER — CHLORHEXIDINE GLUCONATE 4 % EX SOLN
60.0000 mL | Freq: Once | CUTANEOUS | Status: AC
  Administered 2023-06-21: 4 via TOPICAL

## 2023-06-21 MED ORDER — ACETAMINOPHEN 10 MG/ML IV SOLN
1000.0000 mg | Freq: Four times a day (QID) | INTRAVENOUS | Status: DC
  Administered 2023-06-21 – 2023-06-22 (×3): 1000 mg via INTRAVENOUS
  Filled 2023-06-21 (×3): qty 100

## 2023-06-21 MED ORDER — OXYCODONE HCL 5 MG PO TABS: 5.0000 mg | ORAL_TABLET | ORAL | Status: DC | PRN

## 2023-06-21 MED ORDER — ACETAMINOPHEN 10 MG/ML IV SOLN
INTRAVENOUS | Status: AC
  Filled 2023-06-21: qty 100

## 2023-06-21 MED ORDER — ACETAMINOPHEN 10 MG/ML IV SOLN
INTRAVENOUS | Status: DC | PRN
  Administered 2023-06-21: 1000 mg via INTRAVENOUS

## 2023-06-21 MED ORDER — PANTOPRAZOLE SODIUM 40 MG PO TBEC
40.0000 mg | DELAYED_RELEASE_TABLET | Freq: Two times a day (BID) | ORAL | Status: DC
  Administered 2023-06-21 – 2023-06-22 (×2): 40 mg via ORAL
  Filled 2023-06-21 (×2): qty 1

## 2023-06-21 MED ORDER — PROPOFOL 1000 MG/100ML IV EMUL
INTRAVENOUS | Status: AC
  Filled 2023-06-21: qty 100

## 2023-06-21 MED ORDER — PHENYLEPHRINE HCL-NACL 20-0.9 MG/250ML-% IV SOLN
INTRAVENOUS | Status: DC | PRN
  Administered 2023-06-21: 20 ug/min via INTRAVENOUS
  Administered 2023-06-21: 80 ug via INTRAVENOUS

## 2023-06-21 MED ORDER — MIDAZOLAM HCL 5 MG/5ML IJ SOLN
INTRAMUSCULAR | Status: DC | PRN
  Administered 2023-06-21: 2 mg via INTRAVENOUS

## 2023-06-21 MED ORDER — GLYCOPYRROLATE 0.2 MG/ML IJ SOLN
INTRAMUSCULAR | Status: DC | PRN
  Administered 2023-06-21: .2 mg via INTRAVENOUS

## 2023-06-21 MED ORDER — DIPHENHYDRAMINE HCL 12.5 MG/5ML PO ELIX: 12.5000 mg | ORAL_SOLUTION | ORAL | Status: DC | PRN

## 2023-06-21 MED ORDER — BUPIVACAINE HCL (PF) 0.5 % IJ SOLN
INTRAMUSCULAR | Status: AC
  Filled 2023-06-21: qty 10

## 2023-06-21 MED ORDER — PHENOL 1.4 % MT LIQD: 1.0000 | OROMUCOSAL | Status: DC | PRN

## 2023-06-21 MED ORDER — TRANEXAMIC ACID-NACL 1000-0.7 MG/100ML-% IV SOLN: 1000.0000 mg | INTRAVENOUS | Status: DC

## 2023-06-21 MED ORDER — TRANEXAMIC ACID-NACL 1000-0.7 MG/100ML-% IV SOLN
INTRAVENOUS | Status: AC
  Filled 2023-06-21: qty 100

## 2023-06-21 MED ORDER — SODIUM CHLORIDE (PF) 0.9 % IJ SOLN
INTRAMUSCULAR | Status: DC | PRN
  Administered 2023-06-21: 120 mL via INTRAMUSCULAR

## 2023-06-21 MED ORDER — ACETAMINOPHEN 325 MG PO TABS: 325.0000 mg | ORAL_TABLET | Freq: Four times a day (QID) | ORAL | Status: DC | PRN

## 2023-06-21 MED ORDER — SENNOSIDES-DOCUSATE SODIUM 8.6-50 MG PO TABS
1.0000 | ORAL_TABLET | Freq: Two times a day (BID) | ORAL | Status: DC
  Administered 2023-06-21 – 2023-06-22 (×2): 1 via ORAL
  Filled 2023-06-21 (×2): qty 1

## 2023-06-21 MED ORDER — ORAL CARE MOUTH RINSE: 15.0000 mL | Freq: Once | OROMUCOSAL | Status: AC

## 2023-06-21 MED ORDER — ONDANSETRON HCL 4 MG PO TABS: 4.0000 mg | ORAL_TABLET | Freq: Four times a day (QID) | ORAL | Status: DC | PRN

## 2023-06-21 MED ORDER — LACTATED RINGERS IV SOLN: INTRAVENOUS | Status: DC

## 2023-06-21 MED ORDER — CHLORHEXIDINE GLUCONATE 0.12 % MT SOLN
OROMUCOSAL | Status: AC
  Filled 2023-06-21: qty 15

## 2023-06-21 MED ORDER — CEFAZOLIN SODIUM-DEXTROSE 2-4 GM/100ML-% IV SOLN
INTRAVENOUS | Status: AC
  Filled 2023-06-21: qty 100

## 2023-06-21 MED ORDER — SURGIRINSE WOUND IRRIGATION SYSTEM - OPTIME
TOPICAL | Status: DC | PRN
  Administered 2023-06-21: 450 mL

## 2023-06-21 MED ORDER — ONDANSETRON HCL 4 MG/2ML IJ SOLN
INTRAMUSCULAR | Status: AC
  Filled 2023-06-21: qty 2

## 2023-06-21 MED ORDER — DEXAMETHASONE SODIUM PHOSPHATE 10 MG/ML IJ SOLN
INTRAMUSCULAR | Status: AC
  Filled 2023-06-21: qty 1

## 2023-06-21 MED ORDER — CELECOXIB 200 MG PO CAPS
400.0000 mg | ORAL_CAPSULE | Freq: Once | ORAL | Status: AC
  Administered 2023-06-21: 400 mg via ORAL

## 2023-06-21 MED ORDER — CHLORHEXIDINE GLUCONATE 0.12 % MT SOLN
15.0000 mL | Freq: Once | OROMUCOSAL | Status: AC
  Administered 2023-06-21: 15 mL via OROMUCOSAL

## 2023-06-21 MED ORDER — PROPOFOL 10 MG/ML IV BOLUS
INTRAVENOUS | Status: AC
  Filled 2023-06-21: qty 20

## 2023-06-21 MED ORDER — GABAPENTIN 300 MG PO CAPS
ORAL_CAPSULE | ORAL | Status: AC
  Filled 2023-06-21: qty 1

## 2023-06-21 MED ORDER — ENSURE PRE-SURGERY PO LIQD
296.0000 mL | Freq: Once | ORAL | Status: DC
  Filled 2023-06-21: qty 296

## 2023-06-21 MED ORDER — BUPIVACAINE LIPOSOME 1.3 % IJ SUSP
INTRAMUSCULAR | Status: AC
  Filled 2023-06-21: qty 20

## 2023-06-21 MED ORDER — OXYCODONE HCL 5 MG/5ML PO SOLN: 5.0000 mg | Freq: Once | ORAL | Status: DC | PRN

## 2023-06-21 MED ORDER — HYDROCHLOROTHIAZIDE 12.5 MG PO TABS
12.5000 mg | ORAL_TABLET | Freq: Every day | ORAL | Status: DC
  Administered 2023-06-22: 12.5 mg via ORAL
  Filled 2023-06-21: qty 1

## 2023-06-21 MED ORDER — GLYCOPYRROLATE 0.2 MG/ML IJ SOLN
INTRAMUSCULAR | Status: AC
  Filled 2023-06-21: qty 1

## 2023-06-21 MED ORDER — OXYCODONE HCL 5 MG PO TABS: 5.0000 mg | ORAL_TABLET | Freq: Once | ORAL | Status: DC | PRN

## 2023-06-21 MED ORDER — SODIUM CHLORIDE (PF) 0.9 % IJ SOLN
INTRAMUSCULAR | Status: AC
  Filled 2023-06-21: qty 40

## 2023-06-21 MED ORDER — SODIUM CHLORIDE 0.9 % IR SOLN
Status: DC | PRN
  Administered 2023-06-21: 3000 mL

## 2023-06-21 MED ORDER — ROSUVASTATIN CALCIUM 10 MG PO TABS
10.0000 mg | ORAL_TABLET | ORAL | Status: DC
  Administered 2023-06-22: 10 mg via ORAL
  Filled 2023-06-21: qty 1

## 2023-06-21 MED ORDER — CHLORHEXIDINE GLUCONATE 4 % EX SOLN: 1.0000 | CUTANEOUS | 1 refills | Status: DC

## 2023-06-21 MED ORDER — CELECOXIB 200 MG PO CAPS
ORAL_CAPSULE | ORAL | Status: AC
  Filled 2023-06-21: qty 2

## 2023-06-21 SURGICAL SUPPLY — 65 items
ATTUNE MED DOME PAT 41 KNEE (Knees) IMPLANT
ATTUNE PS FEM LT SZ 6 CEM KNEE (Femur) IMPLANT
ATTUNE PSRP INSR SZ6 10 KNEE (Insert) IMPLANT
BASE TIBIAL ROT PLAT SZ 7 KNEE (Knees) IMPLANT
BATTERY INSTRU NAVIGATION (MISCELLANEOUS) ×4 IMPLANT
BIT DRILL QUICK REL 1/8 2PK SL (BIT) ×1 IMPLANT
BLADE CLIPPER SURG (BLADE) IMPLANT
BLADE SAW 70X12.5 (BLADE) ×1 IMPLANT
BLADE SAW 90X13X1.19 OSCILLAT (BLADE) ×1 IMPLANT
BLADE SAW 90X25X1.19 OSCILLAT (BLADE) ×1 IMPLANT
BONE CEMENT GENTAMICIN (Cement) ×2 IMPLANT
BRUSH SCRUB EZ PLAIN DRY (MISCELLANEOUS) ×1 IMPLANT
CEMENT BONE GENTAMICIN 40 (Cement) IMPLANT
COOLER POLAR GLACIER W/PUMP (MISCELLANEOUS) ×1 IMPLANT
CUFF TRNQT CYL 30X4X21-28X (TOURNIQUET CUFF) IMPLANT
DRAPE SHEET LG 3/4 BI-LAMINATE (DRAPES) ×1 IMPLANT
DRSG AQUACEL AG ADV 3.5X14 (GAUZE/BANDAGES/DRESSINGS) ×1 IMPLANT
DRSG MEPILEX SACRM 8.7X9.8 (GAUZE/BANDAGES/DRESSINGS) ×1 IMPLANT
DRSG TEGADERM 4X4.75 (GAUZE/BANDAGES/DRESSINGS) ×1 IMPLANT
DURAPREP 26ML APPLICATOR (WOUND CARE) ×2 IMPLANT
ELECT CAUTERY BLADE 6.4 (BLADE) ×1 IMPLANT
ELECT REM PT RETURN 9FT ADLT (ELECTROSURGICAL) ×1
ELECTRODE REM PT RTRN 9FT ADLT (ELECTROSURGICAL) ×1 IMPLANT
EVACUATOR 1/8 PVC DRAIN (DRAIN) ×1 IMPLANT
EX-PIN ORTHOLOCK NAV 4X150 (PIN) ×2 IMPLANT
GAUZE XEROFORM 1X8 LF (GAUZE/BANDAGES/DRESSINGS) ×1 IMPLANT
GLOVE BIOGEL M STRL SZ7.5 (GLOVE) ×6 IMPLANT
GLOVE SURG UNDER POLY LF SZ8 (GLOVE) ×2 IMPLANT
GOWN STRL REUS W/ TWL LRG LVL3 (GOWN DISPOSABLE) ×1 IMPLANT
GOWN STRL REUS W/ TWL XL LVL3 (GOWN DISPOSABLE) ×1 IMPLANT
GOWN TOGA ZIPPER T7+ PEEL AWAY (MISCELLANEOUS) ×1 IMPLANT
HOLDER FOLEY CATH W/STRAP (MISCELLANEOUS) ×1 IMPLANT
HOOD PEEL AWAY T7 (MISCELLANEOUS) ×1 IMPLANT
IV NS IRRIG 3000ML ARTHROMATIC (IV SOLUTION) ×1 IMPLANT
KIT TURNOVER KIT A (KITS) ×1 IMPLANT
KNIFE SCULPS 14X20 (INSTRUMENTS) ×1 IMPLANT
MANIFOLD NEPTUNE II (INSTRUMENTS) ×2 IMPLANT
NDL SPNL 20GX3.5 QUINCKE YW (NEEDLE) ×2 IMPLANT
NEEDLE SPNL 20GX3.5 QUINCKE YW (NEEDLE) ×2
PACK TOTAL KNEE (MISCELLANEOUS) ×1 IMPLANT
PAD ABD DERMACEA PRESS 5X9 (GAUZE/BANDAGES/DRESSINGS) ×2 IMPLANT
PAD ARMBOARD 7.5X6 YLW CONV (MISCELLANEOUS) ×3 IMPLANT
PAD WRAPON POLAR KNEE (MISCELLANEOUS) ×1 IMPLANT
PENCIL SMOKE EVACUATOR COATED (MISCELLANEOUS) ×1 IMPLANT
PIN DRILL FIX HALF THREAD (BIT) ×2 IMPLANT
PIN FIXATION 1/8DIA X 3INL (PIN) ×1 IMPLANT
PULSAVAC PLUS IRRIG FAN TIP (DISPOSABLE) ×1
SOLUTION IRRIG SURGIPHOR (IV SOLUTION) ×1 IMPLANT
SPONGE DRAIN TRACH 4X4 STRL 2S (GAUZE/BANDAGES/DRESSINGS) ×1 IMPLANT
STAPLER SKIN PROX 35W (STAPLE) ×1 IMPLANT
STOCKINETTE STRL BIAS CUT 8X4 (MISCELLANEOUS) ×1 IMPLANT
STRAP TIBIA SHORT (MISCELLANEOUS) ×1 IMPLANT
SUCTION TUBE FRAZIER 10FR DISP (SUCTIONS) ×1 IMPLANT
SUT VIC AB 0 CT1 36 (SUTURE) ×1 IMPLANT
SUT VIC AB 1 CT1 36 (SUTURE) ×2 IMPLANT
SUT VIC AB 2-0 CT2 27 (SUTURE) ×1 IMPLANT
SYR 30ML LL (SYRINGE) ×2 IMPLANT
TIBIAL BASE ROT PLAT SZ 7 KNEE (Knees) ×1 IMPLANT
TIP FAN IRRIG PULSAVAC PLUS (DISPOSABLE) ×1 IMPLANT
TOWEL OR 17X26 4PK STRL BLUE (TOWEL DISPOSABLE) ×1 IMPLANT
TOWER CARTRIDGE SMART MIX (DISPOSABLE) ×1 IMPLANT
TRAP FLUID SMOKE EVACUATOR (MISCELLANEOUS) ×1 IMPLANT
TRAY FOLEY MTR SLVR 16FR STAT (SET/KITS/TRAYS/PACK) ×1 IMPLANT
WATER STERILE IRR 1000ML POUR (IV SOLUTION) ×1 IMPLANT
WRAPON POLAR PAD KNEE (MISCELLANEOUS) ×1
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
## 2023-06-21 NOTE — Evaluation (Signed)
Physical Therapy Evaluation Patient Details Name: Ralph Giles MRN: [id number] DOB: [birth year] Today's Date: 06/21/2023  History of Present Illness  Pt is a 68 y.o. male s/p L TKA on 06/21/23.  Clinical Impression  Pt admitted with above diagnosis. Pt currently with functional limitations due to the deficits listed below (see PT Problem List). Pt received upright in bed agreeable to PT with family present. At baseline pt reports he is indep with gait and ADL's.   To date, pt educated on WB status, L knee positioning to prevent knee flexion contracture and reviewed HEP. Sensation intact to LT, good quad activation with quad set but unable to SLR. Packet provided for HEP. Pt is supervision for bed mobility with bed features. Reliant on MinA for STS to RW for bouts of momentum and needs VC's for hand placement to stand. Completes Step pivot transfer with RW without issue needing VC's for safe sitting technique. LLE in extension, polar care applied with all needs in reach. Pt will be appropriate for expected d/c outcomes pending stairs and gait progression. Pt with all needs in reach.    If plan is discharge home, recommend the following: A little help with walking and/or transfers;A little help with bathing/dressing/bathroom;Assistance with cooking/housework;Assist for transportation;Help with stairs or ramp for entrance   Can travel by private vehicle        Equipment Recommendations None recommended by PT  Recommendations for Other Services       Functional Status Assessment Patient has had a recent decline in their functional status and demonstrates the ability to make significant improvements in function in a reasonable and predictable amount of time.     Precautions / Restrictions Precautions Precautions: Knee Precaution Booklet Issued: Yes (comment) Restrictions Weight Bearing Restrictions Per Provider Order: Yes LLE Weight Bearing Per Provider Order: Weight bearing as  tolerated      Mobility  Bed Mobility Overal bed mobility: Needs Assistance Bed Mobility: Supine to Sit     Supine to sit: Supervision, Used rails     General bed mobility comments: increased time Patient Response: Cooperative  Transfers Overall transfer level: Needs assistance Equipment used: Rolling walker (2 wheels) Transfers: Sit to/from Stand, Bed to chair/wheelchair/BSC Sit to Stand: Min assist   Step pivot transfers: Contact guard assist       General transfer comment: Use of momentum, increased time.    Ambulation/Gait                  Stairs            Wheelchair Mobility     Tilt Bed Tilt Bed Patient Response: Cooperative  Modified Rankin (Stroke Patients Only)       Balance Overall balance assessment: Needs assistance Sitting-balance support: Bilateral upper extremity supported, Feet supported Sitting balance-Leahy Scale: Normal     Standing balance support: Bilateral upper extremity supported, During functional activity, Reliant on assistive device for balance Standing balance-Leahy Scale: Poor Standing balance comment: Reliant on AD                             Pertinent Vitals/Pain Pain Assessment Pain Assessment: Faces Faces Pain Scale: Hurts little more Pain Location: L anterior knee Pain Descriptors / Indicators: Aching, Burning Pain Intervention(s): Limited activity within patient's tolerance, Monitored during session, Repositioned, Ice applied    Home Living Family/patient expects to be discharged to:: Private residence Living Arrangements: Spouse/significant other Available Help at Discharge: Family;Available 24  hours/day Type of Home: House Home Access: Stairs to enter Entrance Stairs-Rails: Right;Left;Can reach both Entrance Stairs-Number of Steps: 5 from back door   Home Layout: One level Home Equipment: Agricultural consultant (2 wheels);BSC/3in1;Shower seat - built in      Prior Function Prior Level of  Function : Independent/Modified Independent             Mobility Comments: IND ADLs Comments: IND     Extremity/Trunk Assessment   Upper Extremity Assessment Upper Extremity Assessment: Overall WFL for tasks assessed    Lower Extremity Assessment Lower Extremity Assessment: LLE deficits/detail LLE Deficits / Details: expected post op L TKA strength and ROM deficits LLE Sensation: WNL    Cervical / Trunk Assessment Cervical / Trunk Assessment: Normal  Communication   Communication Communication: No apparent difficulties    Cognition Arousal: Alert Behavior During Therapy: WFL for tasks assessed/performed   PT - Cognitive impairments: No apparent impairments                       PT - Cognition Comments: Very pleasant and cooperative. Following commands: Intact       Cueing Cueing Techniques: Verbal cues     General Comments      Exercises Total Joint Exercises Ankle Circles/Pumps: AROM, Strengthening, Both, 10 reps, Supine Quad Sets: AROM, Strengthening, Left, 10 reps, Supine Heel Slides: AROM, Strengthening, Left, 10 reps, Supine Hip ABduction/ADduction: AROM, Strengthening, Left, 10 reps, Supine Long Arc Quad: AROM, Strengthening, Left, 10 reps, Seated Goniometric ROM: 0-82 Other Exercises Other Exercises: Role of PT in acute setting, provided HEP (reps/sets/frequency), WB status, L knee placement to prevent knee flexion contracture   Assessment/Plan    PT Assessment Patient needs continued PT services  PT Problem List Decreased strength;Decreased range of motion;Decreased knowledge of use of DME;Decreased activity tolerance;Decreased balance;Pain       PT Treatment Interventions DME instruction;Gait training;Stair training;Functional mobility training;Patient/family education;Therapeutic activities;Therapeutic exercise;Balance training    PT Goals (Current goals can be found in the Care Plan section)  Acute Rehab PT Goals Patient Stated  Goal: return home PT Goal Formulation: With patient Time For Goal Achievement: 07/05/23 Potential to Achieve Goals: Good    Frequency BID     Co-evaluation               AM-PAC PT "6 Clicks" Mobility  Outcome Measure Help needed turning from your back to your side while in a flat bed without using bedrails?: A Little Help needed moving from lying on your back to sitting on the side of a flat bed without using bedrails?: A Little Help needed moving to and from a bed to a chair (including a wheelchair)?: A Little Help needed standing up from a chair using your arms (e.g., wheelchair or bedside chair)?: A Little Help needed to walk in hospital room?: A Little Help needed climbing 3-5 steps with a railing? : A Lot 6 Click Score: 17    End of Session Equipment Utilized During Treatment: Gait belt Activity Tolerance: Patient tolerated treatment well Patient left: in chair;with call bell/phone within reach;with chair alarm set;with family/visitor present Nurse Communication: Mobility status PT Visit Diagnosis: Other abnormalities of gait and mobility (R26.89);Muscle weakness (generalized) (M62.81);Difficulty in walking, not elsewhere classified (R26.2);Pain Pain - Right/Left: Left Pain - part of body: Knee    Time: 1610-9604 PT Time Calculation (min) (ACUTE ONLY): 21 min   Charges:   PT Evaluation $PT Eval Low Complexity: 1 Low PT Treatments $Therapeutic  Exercise: 8-22 mins PT General Charges $$ ACUTE PT VISIT: 1 Visit         Delphia Grates. Fairly IV, PT, DPT Physical Therapist- [place]  [facility]  06/21/2023, 4:06 PM

## 2023-06-21 NOTE — Plan of Care (Signed)

## 2023-06-21 NOTE — Progress Notes (Signed)
Patient is not able to walk the distance required to go the bathroom, or he/she is unable to safely negotiate stairs required to access the bathroom.  A 3in1 BSC will alleviate this problem   Amenda Duclos P. Angie Fava M.D.

## 2023-06-21 NOTE — Op Note (Signed)
OPERATIVE NOTE  DATE OF SURGERY:  06/21/2023  PATIENT NAME:  Jaqwan Wieber Krigbaum   DOB: [birth year]  MRN: [id number]  PRE-OPERATIVE DIAGNOSIS: Degenerative arthrosis of the left knee, primary  POST-OPERATIVE DIAGNOSIS:  Same  PROCEDURE:  Left total knee arthroplasty using computer-assisted navigation  SURGEON:  Jena Gauss. M.D.  ANESTHESIA: spinal  ESTIMATED BLOOD LOSS: 50 mL  FLUIDS REPLACED: 800 mL of crystalloid  TOURNIQUET TIME: 93 minutes  DRAINS: 2 medium Hemovac drains  SOFT TISSUE RELEASES: Anterior cruciate ligament, posterior cruciate ligament, deep medial collateral ligament, patellofemoral ligament  IMPLANTS UTILIZED: DePuy Attune size 6 posterior stabilized femoral component (cemented), size 7 rotating platform tibial component (cemented), 41 mm medialized dome patella (cemented), and a 10 mm stabilized rotating platform polyethylene insert.  INDICATIONS FOR SURGERY: OREY MOURE is a 68 y.o. year old male with a long history of progressive knee pain. X-rays demonstrated severe degenerative changes in tricompartmental fashion. The patient had not seen any significant improvement despite conservative nonsurgical intervention. After discussion of the risks and benefits of surgical intervention, the patient expressed understanding of the risks benefits and agree with plans for total knee arthroplasty.   The risks, benefits, and alternatives were discussed at length including but not limited to the risks of infection, bleeding, nerve injury, stiffness, blood clots, the need for revision surgery, cardiopulmonary complications, among others, and they were willing to proceed.  PROCEDURE IN DETAIL: The patient was brought into the operating room and, after adequate spinal anesthesia was achieved, a tourniquet was placed on the patient's upper thigh. The patient's knee and leg were cleaned and prepped with alcohol and DuraPrep and draped in the usual sterile fashion.  A "timeout" was performed as per usual protocol. The lower extremity was exsanguinated using an Esmarch, and the tourniquet was inflated to 300 mmHg. An anterior longitudinal incision was made followed by a standard mid vastus approach. The deep fibers of the medial collateral ligament were elevated in a subperiosteal fashion off of the medial flare of the tibia so as to maintain a continuous soft tissue sleeve. The patella was subluxed laterally and the patellofemoral ligament was incised. Inspection of the knee demonstrated severe degenerative changes with full-thickness loss of articular cartilage. Osteophytes were debrided using a rongeur. Anterior and posterior cruciate ligaments were excised. Two 4.0 mm Schanz pins were inserted in the femur and into the tibia for attachment of the array of trackers used for computer-assisted navigation. Hip center was identified using a circumduction technique. Distal landmarks were mapped using the computer. The distal femur and proximal tibia were mapped using the computer. The distal femoral cutting guide was positioned using computer-assisted navigation so as to achieve a 5 distal valgus cut. The femur was sized and it was felt that a size 6 femoral component was appropriate. A size 6 femoral cutting guide was positioned and the anterior cut was performed and verified using the computer. This was followed by completion of the posterior and chamfer cuts. Femoral cutting guide for the central box was then positioned in the center box cut was performed.  Attention was then directed to the proximal tibia. Medial and lateral menisci were excised. The extramedullary tibial cutting guide was positioned using computer-assisted navigation so as to achieve a 0 varus-valgus alignment and 3 posterior slope. The cut was performed and verified using the computer. The proximal tibia was sized and it was felt that a size 7 tibial tray was appropriate. Tibial and femoral trials were  inserted followed  by insertion of a 10 mm polyethylene insert. This allowed for excellent mediolateral soft tissue balancing both in flexion and in full extension. Finally, the patella was cut and prepared so as to accommodate a 41 mm medialized dome patella. A patella trial was placed and the knee was placed through a range of motion with excellent patellar tracking appreciated. The femoral trial was removed after debridement of posterior osteophytes. The central post-hole for the tibial component was reamed followed by insertion of a keel punch. Tibial trials were then removed. Cut surfaces of bone were irrigated with copious amounts of normal saline using pulsatile lavage and then suctioned dry. Polymethylmethacrylate cement with gentamicin was prepared in the usual fashion using a vacuum mixer. Cement was applied to the cut surface of the proximal tibia as well as along the undersurface of a size 7 rotating platform tibial component. Tibial component was positioned and impacted into place. Excess cement was removed using Personal assistant. Cement was then applied to the cut surfaces of the femur as well as along the posterior flanges of the size 6 femoral component. The femoral component was positioned and impacted into place. Excess cement was removed using Personal assistant. A 10 mm polyethylene trial was inserted and the knee was brought into full extension with steady axial compression applied. Finally, cement was applied to the backside of a 41 mm medialized dome patella and the patellar component was positioned and patellar clamp applied. Excess cement was removed using Personal assistant. After adequate curing of the cement, the tourniquet was deflated after a total tourniquet time of 93 minutes. Hemostasis was achieved using electrocautery. The knee was irrigated with copious amounts of normal saline using pulsatile lavage followed by 450 ml of Surgiphor and then suctioned dry. 20 mL of 1.3% Exparel and 60 mL  of 0.25% Marcaine in 40 mL of normal saline was injected along the posterior capsule, medial and lateral gutters, and along the arthrotomy site. A 10 mm stabilized rotating platform polyethylene insert was inserted and the knee was placed through a range of motion with excellent mediolateral soft tissue balancing appreciated and excellent patellar tracking noted. 2 medium drains were placed in the wound bed and brought out through separate stab incisions. The medial parapatellar portion of the incision was reapproximated using interrupted sutures of #1 Vicryl. Subcutaneous tissue was approximated in layers using first #0 Vicryl followed #2-0 Vicryl. The skin was approximated with skin staples. A sterile dressing was applied.  The patient tolerated the procedure well and was transported to the recovery room in stable condition.    Teaghan Formica P. Angie Fava., M.D.

## 2023-06-21 NOTE — Transfer of Care (Signed)
Immediate Anesthesia Transfer of Care Note  Patient: Ralph Giles  Procedure(s) Performed: COMPUTER ASSISTED TOTAL KNEE ARTHROPLASTY - RNFA (Left: Knee)  Patient Location: PACU  Anesthesia Type:Spinal  Level of Consciousness: drowsy  Airway & Oxygen Therapy: Patient Spontanous Breathing and Patient connected to face mask oxygen  Post-op Assessment: Report given to RN and Post -op Vital signs reviewed and stable  Post vital signs: Reviewed and stable  Last Vitals:  Vitals Value Taken Time  BP 126/77 06/21/23 1135  Temp    Pulse 85 06/21/23 1138  Resp 18 06/21/23 1138  SpO2 97 % 06/21/23 1138  Vitals shown include unfiled device data.  Last Pain:  Vitals:   06/21/23 0631  TempSrc: Temporal  PainSc: 3          Complications: No notable events documented.

## 2023-06-21 NOTE — Consult Note (Signed)
   Consult note  68 y.o. male scheduled for total knee replacement and Foley catheter unable to be placed.  On chart review he saw Dr. Richardo Hanks 11/15/2021 for bloody penile discharge however had significant phimosis.  CT urogram and cystoscopy was recommended.  He deferred evaluation and elected a 4-6 week follow-up for repeat assessment however did not follow-up.  Exam: GU-tight phimosis.  Unable to visualize urethral meatus  The prepuce was elevated and the opening was dilated with a hemostat.  With elevation of the prepuce the meatus was able to be visualized and a 16 French Foley catheter was placed without difficulty with return of clear urine  Recommendation: May DC Foley catheter per your routine Recommend office follow-up with Dr. Angelina Sheriff, MD

## 2023-06-21 NOTE — TOC Initial Note (Addendum)
Transition of Care [facility]) - Initial/Assessment Note    Patient Details  Name: Ralph Giles MRN: [id number] Date of Birth: 08-Feb-1956  Transition of Care [facility]) CM/SW Contact:    Truddie Hidden, RN Phone Number: 06/21/2023, 3:07 PM  Clinical Narrative:                 Request for RW and [facility] sent to Jon from [employer].   Patient HH previously arranged with [facility]   3:59pm Retrieved message from [employer] stating patient already received a walker a few months ago.  Spoke with patient and his wife. Patient has several walkers and a BSC commode at home. They delined to receive BSC and RW. [employer] notified.         Patient Goals and CMS Choice            Expected Discharge Plan and Services                                              Prior Living Arrangements/Services                       Activities of Daily Living      Permission Sought/Granted                  Emotional Assessment              Admission diagnosis:  History of total knee arthroplasty, left [Z96.652] Patient Active Problem List   Diagnosis Date Noted   History of total knee arthroplasty, left 06/21/2023   Spondylolisthesis of lumbar region 10/31/2022   Chronic bilateral low back pain with bilateral sciatica 10/31/2022   S/P lumbar fusion 10/31/2022   Lumbar stenosis without neurogenic claudication 01/20/2020   Acute bilateral low back pain with bilateral sciatica 12/31/2019   BMI 40.0-44.9, adult (HCC) 06/16/2019   Left arm pain 03/26/2019   Numbness and tingling in left arm 03/26/2019   Special screening for malignant neoplasms, colon    Benign neoplasm of transverse colon    Rectal polyp    Pure hyperglyceridemia 06/26/2016   Class 2 obesity due to excess calories without serious comorbidity with body mass index (BMI) of 39.0 to 39.9 in adult 06/26/2016   Routine general medical examination at a health care facility 09/03/2014   Essential hypertension  09/03/2014   Dysmetabolic syndrome 09/03/2014   Mixed hyperlipidemia 09/03/2014   Adult BMI 30+ 09/03/2014   Arthritis of knee, degenerative 09/03/2014   PCP:  Duanne Limerick, MD Pharmacy:   [facility] [address], [place] - [address] [place] [address] Phone: [phone] Fax: [phone]  [facility] - [place], [place] - [address] [place] [address] Phone: [phone] Fax: [phone]     Social Drivers of Health (SDOH) Social History: SDOH Screenings   Food Insecurity: No Food Insecurity (04/09/2023)   Received from [facility]  Housing: Low Risk  (06/04/2023)   Received from [facility]  Transportation Needs: No Transportation Needs (04/09/2023)   Received from [facility]  Utilities: Not At Risk (04/09/2023)   Received from [facility]  Depression [phone]): Low Risk  (06/17/2023)  Financial Resource Strain: Low Risk  (04/09/2023)   Received from [facility]  Tobacco Use: Low Risk  (06/21/2023)   SDOH Interventions:     Readmission Risk Interventions     No data to display

## 2023-06-21 NOTE — Anesthesia Procedure Notes (Signed)
Spinal  Patient location during procedure: OR Start time: 06/21/2023 7:20 AM End time: 06/21/2023 7:24 AM Reason for block: surgical anesthesia Staffing Performed: resident/CRNA  Anesthesiologist: Piscitello, Cleda Mccreedy, MD Resident/CRNA: Hezzie Bump, CRNA Performed by: Hezzie Bump, CRNA Authorized by: Rosaria Ferries, MD   Preanesthetic Checklist Completed: patient identified, IV checked, site marked, risks and benefits discussed, surgical consent, monitors and equipment checked, pre-op evaluation and timeout performed Spinal Block Patient position: sitting Prep: Betadine Patient monitoring: heart rate, continuous pulse ox, blood pressure and cardiac monitor Approach: midline Location: L4-5 Injection technique: single-shot Needle Needle type: Introducer and Pencan  Needle gauge: 24 G Needle length: 9 cm Assessment Events: CSF return Additional Notes Negative paresthesia. Negative blood return. Positive free-flowing CSF. Expiration date of kit checked and confirmed. Patient tolerated procedure well, without complications.

## 2023-06-21 NOTE — Anesthesia Procedure Notes (Signed)
Procedure Name: MAC Date/Time: 06/21/2023 7:20 AM  Performed by: Hezzie Bump, CRNAPre-anesthesia Checklist: Patient identified, Emergency Drugs available, Suction available and Patient being monitored Patient Re-evaluated:Patient Re-evaluated prior to induction Oxygen Delivery Method: Simple face mask Induction Type: IV induction Ventilation: Oral airway inserted - appropriate to patient size Placement Confirmation: positive ETCO2

## 2023-06-21 NOTE — Interval H&P Note (Signed)
History and Physical Interval Note:  06/21/2023 6:08 AM  Ralph Giles  has presented today for surgery, with the diagnosis of PRIMARY OSTEOARTHRITIS OF LEFT KNEE..  The various methods of treatment have been discussed with the patient and family. After consideration of risks, benefits and other options for treatment, the patient has consented to  Procedure(s): COMPUTER ASSISTED TOTAL KNEE ARTHROPLASTY - RNFA (Left) as a surgical intervention.  The patient's history has been reviewed, patient examined, no change in status, stable for surgery.  I have reviewed the patient's chart and labs.  Questions were answered to the patient's satisfaction.     Brandonn Capelli P Sherrel Ploch

## 2023-06-22 DIAGNOSIS — Z96642 Presence of left artificial hip joint: Secondary | ICD-10-CM | POA: Diagnosis not present

## 2023-06-22 DIAGNOSIS — I10 Essential (primary) hypertension: Secondary | ICD-10-CM | POA: Diagnosis not present

## 2023-06-22 DIAGNOSIS — Z79899 Other long term (current) drug therapy: Secondary | ICD-10-CM | POA: Diagnosis not present

## 2023-06-22 DIAGNOSIS — M1712 Unilateral primary osteoarthritis, left knee: Secondary | ICD-10-CM | POA: Diagnosis not present

## 2023-06-22 MED ORDER — TRAMADOL HCL 50 MG PO TABS: 50.0000 mg | ORAL_TABLET | ORAL | 0 refills | Status: DC | PRN

## 2023-06-22 MED ORDER — OXYCODONE HCL 5 MG PO TABS: 5.0000 mg | ORAL_TABLET | ORAL | 0 refills | Status: DC | PRN

## 2023-06-22 MED ORDER — ASPIRIN 81 MG PO CHEW: 81.0000 mg | CHEWABLE_TABLET | Freq: Two times a day (BID) | ORAL | Status: DC

## 2023-06-22 MED ORDER — CELECOXIB 200 MG PO CAPS: 200.0000 mg | ORAL_CAPSULE | Freq: Two times a day (BID) | ORAL | 1 refills | Status: DC

## 2023-06-22 NOTE — Plan of Care (Signed)

## 2023-06-22 NOTE — Progress Notes (Addendum)
OT Cancellation Note  Patient Details Name: Ralph Giles MRN: [id number] DOB: 1955-11-13   Cancelled Treatment:    Reason Eval/Treat Not Completed: OT screened, no needs identified, will sign off. OT consult received, chart reviewed. Pt observed ambulating in hallway with PT. Pt/family deferred OT evaluation at this time stating they have all the necessary DME (BSC, RW) and AE (reacher, sock aide, shoe horn). Pt has had OT previously from back sx and wife reported receiving [facility] AE when she had previous knee sx. Pt denied any self-care concerns and stated his wife will be available 24/7 upon discharge to assist with ADLs/IADLs as needed. Plans for discharge home today per pt. Will complete orders. Please re-consult if there are any acute changes.   Gerrie Nordmann 06/22/2023, 9:53 AM

## 2023-06-22 NOTE — TOC Transition Note (Signed)
Transition of Care [facility]) - Discharge Note   Patient Details  Name: Ralph Giles MRN: [id number] Date of Birth: 08/02/1955  Transition of Care [facility]) CM/SW Contact:  Rodney Langton, RN Phone Number: 06/22/2023, 10:30 AM   Clinical Narrative:     Patient aware of discharge today, wife at bedside and will provide transportation.  He declines the RW but state he will need BSC.  Ada with [employer] aware, DME to be delivered to the bedside within the hour.  Katina with [facility] notified of discharge.   Final next level of care: Home w Home Health Services Barriers to Discharge: Barriers Resolved   Patient Goals and CMS Choice Patient states their goals for this hospitalization and ongoing recovery are:: Home with [facility]          Discharge Placement                       Discharge Plan and Services Additional resources added to the After Visit Summary for                  DME Arranged: Bedside commode DME Agency: [facility] Date DME Agency Contacted: 06/22/23 Time DME Agency Contacted: 1029 Representative spoke with at DME Agency: Ada HH Arranged: PT, OT HH Agency: [facility] Date [facility] Agency Contacted: 06/22/23 Time HH Agency Contacted: 1030 Representative spoke with at [facility] Agency: Laurelyn Sickle  Social Drivers of Health (SDOH) Interventions SDOH Screenings   Food Insecurity: No Food Insecurity (06/21/2023)  Housing: Low Risk  (06/21/2023)  Transportation Needs: No Transportation Needs (06/21/2023)  Utilities: Not At Risk (06/21/2023)  Depression (PHQ2-9): Low Risk  (06/17/2023)  Financial Resource Strain: Low Risk  (04/09/2023)   Received from [facility]  Social Connections: Socially Integrated (06/21/2023)  Tobacco Use: Low Risk  (06/21/2023)     Readmission Risk Interventions     No data to display

## 2023-06-22 NOTE — Progress Notes (Signed)
Subjective: 1 Day Post-Op Procedure(s) (LRB): COMPUTER ASSISTED TOTAL KNEE ARTHROPLASTY - RNFA (Left) Patient reports pain as mild.   Patient seen in rounds with Dr. Ernest Giles. Patient is well, and has had no acute complaints or problems. Denies any CP, SOB, N/V, fevers or chills We will continue with therapy today.  Plan is to [facility] after hospital stay.  Objective: Vital signs in last 24 hours: Temp:  [97.6 F (36.4 C)-98.2 F (36.8 C)] 97.8 F (36.6 C) (02/15 0747) Pulse Rate:  [63-88] 63 (02/15 0747) Resp:  [10-23] 16 (02/15 0747) BP: (117-160)/(66-86) 136/77 (02/15 0747) SpO2:  [92 %-99 %] 99 % (02/15 0747)  Intake/Output from previous day:  Intake/Output Summary (Last 24 hours) at 06/22/2023 1118 Last data filed at 06/22/2023 0800 Gross per 24 hour  Intake 607 ml  Output 930 ml  Net -323 ml    Intake/Output this shift: Total I/O In: -  Out: 240 [Drains:240]  Labs: No results for input(s): "HGB" in the last 72 hours. No results for input(s): "WBC", "RBC", "HCT", "PLT" in the last 72 hours. No results for input(s): "NA", "K", "CL", "CO2", "BUN", "CREATININE", "GLUCOSE", "CALCIUM" in the last 72 hours. No results for input(s): "LABPT", "INR" in the last 72 hours.  EXAM General - Patient is Alert, Appropriate, and Oriented Extremity - Neurologically intact ABD soft Neurovascular intact Sensation intact distally Intact pulses distally Dorsiflexion/Plantar flexion intact No cellulitis present Compartment soft Dressing - dressing C/D/I and no drainage Motor Function - intact, moving foot and toes well on exam. Able to plantar and dorsi flex with good strength and ROM.\ Neurovascularly intact to all dermatomes down his LLE. Posterior tibial pulses appreciated JP Drain pulled without difficulty. Intact  Past Medical History:  Diagnosis Date   Arthritis    right knee   Benign neoplasm of transverse colon    Degenerative arthritis of left knee    Dysmetabolic  syndrome    Hyperlipidemia    Hypertension    Obesity    Rectal polyp    S/P lumbar fusion    Sleep apnea    does not use cpap   Spinal stenosis     Assessment/Plan: 1 Day Post-Op Procedure(s) (LRB): COMPUTER ASSISTED TOTAL KNEE ARTHROPLASTY - RNFA (Left) Principal Problem:   History of total knee arthroplasty, left  Estimated body mass index is 42.18 kg/m as calculated from the following:   Height as of 06/17/23: 5\' 10"  (1.778 m).   Weight as of 06/17/23: 133.4 kg. Advance diet Up with therapy  Patient will continue to work with physical therapy to pass postoperative PT protocols, ROM and strengthening  Discussed with the patient continuing to utilize Polar Care  Patient will use bone foam in 20-30 minute intervals  Patient will wear TED hose bilaterally to help prevent DVT and clot formation  Discussed the Aquacel bandage.  This bandage will stay in place 7 days postoperatively.  Can be replaced with honeycomb bandages that will be sent home with the patient  Discussed sending the patient home with tramadol and oxycodone for as needed pain management.  Patient will also be sent home with Celebrex to help with swelling and inflammation.  Patient will take an 81 mg aspirin twice daily for DVT prophylaxis  JP drain removed without difficulty, intact  Weight-Bearing as tolerated to left leg  Patient will follow-up with [facility] orthopedics in 2 weeks for staple removal and reevaluation  Ralph Go, PA-C [facility] 06/22/2023, 11:18 AM

## 2023-06-22 NOTE — Progress Notes (Signed)
Physical Therapy Treatment Patient Details Name: Ralph Giles MRN: [id number] DOB: Sep 29, 1955 Today's Date: 06/22/2023   History of Present Illness Pt is a 68 y.o. male s/p L TKA on 06/21/23.    PT Comments  Completed HEP in bed and sitting.  To EOB with no assist.  Stood with cues and CGA x 1.  He is able to progress gait 150' with RW with steady gait and no buckling or LOB.  Completes stair training.  No further questions or concerns for acute stay.  Reviewed post of expectations.  Pt comfortable with discharge and has all equipment needed for home.     If plan is discharge home, recommend the following: A little help with walking and/or transfers;A little help with bathing/dressing/bathroom;Assistance with cooking/housework;Assist for transportation;Help with stairs or ramp for entrance   Can travel by private vehicle        Equipment Recommendations  None recommended by PT    Recommendations for Other Services       Precautions / Restrictions Precautions Precautions: Knee Precaution Booklet Issued: Yes (comment) Restrictions Weight Bearing Restrictions Per Provider Order: Yes LLE Weight Bearing Per Provider Order: Weight bearing as tolerated     Mobility  Bed Mobility Overal bed mobility: Modified Independent                  Transfers Overall transfer level: Modified independent Equipment used: Rolling walker (2 wheels) Transfers: Sit to/from Stand Sit to Stand: Supervision                Ambulation/Gait Ambulation/Gait assistance: Supervision Gait Distance (Feet): 150 Feet Assistive device: Rolling walker (2 wheels) Gait Pattern/deviations: Step-through pattern, Decreased stance time - left Gait velocity: dec     General Gait Details: generally steady with no buckling or LOB   Stairs Stairs: Yes Stairs assistance: Supervision Stair Management: Two rails, Step to pattern Number of Stairs: 4 General stair comments: does  well   Wheelchair Mobility     Tilt Bed    Modified Rankin (Stroke Patients Only)       Balance Overall balance assessment: Needs assistance Sitting-balance support: Bilateral upper extremity supported, Feet supported       Standing balance support: Bilateral upper extremity supported, During functional activity, Reliant on assistive device for balance Standing balance-Leahy Scale: Good                              Communication Communication Communication: No apparent difficulties  Cognition Arousal: Alert Behavior During Therapy: WFL for tasks assessed/performed   PT - Cognitive impairments: No apparent impairments                                Cueing    Exercises Total Joint Exercises Goniometric ROM: 5-80 limited by bulky dressing so measurement is difficult    General Comments        Pertinent Vitals/Pain Pain Assessment Pain Assessment: Faces Faces Pain Scale: Hurts little more Pain Location: L anterior knee Pain Descriptors / Indicators: Aching, Sore Pain Intervention(s): Limited activity within patient's tolerance, Monitored during session, Premedicated before session, Ice applied, Repositioned    Home Living                          Prior Function            PT Goals (  current goals can now be found in the care plan section) Progress towards PT goals: Progressing toward goals    Frequency    BID      PT Plan      Co-evaluation              AM-PAC PT "6 Clicks" Mobility   Outcome Measure  Help needed turning from your back to your side while in a flat bed without using bedrails?: None Help needed moving from lying on your back to sitting on the side of a flat bed without using bedrails?: None Help needed moving to and from a bed to a chair (including a wheelchair)?: A Little Help needed standing up from a chair using your arms (e.g., wheelchair or bedside chair)?: A Little Help needed to  walk in hospital room?: A Little Help needed climbing 3-5 steps with a railing? : A Little 6 Click Score: 20    End of Session Equipment Utilized During Treatment: Gait belt Activity Tolerance: Patient tolerated treatment well Patient left: in chair;with call bell/phone within reach;with chair alarm set;with family/visitor present Nurse Communication: Mobility status PT Visit Diagnosis: Other abnormalities of gait and mobility (R26.89);Muscle weakness (generalized) (M62.81);Difficulty in walking, not elsewhere classified (R26.2);Pain Pain - Right/Left: Left Pain - part of body: Knee     Time: 4098-1191 PT Time Calculation (min) (ACUTE ONLY): 26 min  Charges:    $Gait Training: 8-22 mins $Therapeutic Exercise: 8-22 mins PT General Charges $$ ACUTE PT VISIT: 1 Visit                   Danielle Dess, PTA 06/22/23, 9:54 AM

## 2023-06-22 NOTE — Anesthesia Postprocedure Evaluation (Signed)
Anesthesia Post Note  Patient: Ralph Giles  Procedure(s) Performed: COMPUTER ASSISTED TOTAL KNEE ARTHROPLASTY - RNFA (Left: Knee)  Patient location during evaluation: Nursing Unit Anesthesia Type: Spinal Level of consciousness: oriented and awake and alert Pain management: pain level controlled Vital Signs Assessment: post-procedure vital signs reviewed and stable Respiratory status: spontaneous breathing and respiratory function stable Cardiovascular status: blood pressure returned to baseline and stable Postop Assessment: no headache, no backache, no apparent nausea or vomiting and patient able to bend at knees Anesthetic complications: no  No notable events documented.   Last Vitals:  Vitals:   06/22/23 0200 06/22/23 0747  BP: 135/79 136/77  Pulse: 66 63  Resp: 16 16  Temp: 36.5 C 36.6 C  SpO2: 96% 99%    Last Pain:  Vitals:   06/22/23 0747  TempSrc: Oral  PainSc:                  Stephanie Coup

## 2023-06-22 NOTE — Discharge Summary (Signed)
Physician Discharge Summary  Subjective: 1 Day Post-Op Procedure(s) (LRB): COMPUTER ASSISTED TOTAL KNEE ARTHROPLASTY - RNFA (Left) Patient reports pain as mild.   Patient seen in rounds with Dr. Ernest Pine. Patient is well, and has had no acute complaints or problems. Denies any CP, SOB, N/V, fevers or chills We will continue with therapy today.  Patient is ready to go home  Physician Discharge Summary  Patient ID: Ralph Giles MRN: [id number] DOB/AGE: Mar 16, 1956 68 y.o.  Admit date: 06/21/2023 Discharge date: 06/22/2023  Admission Diagnoses:  Discharge Diagnoses:  Principal Problem:   History of total knee arthroplasty, left   Discharged Condition: good  Hospital Course: Patient presented to the hospital on 06/21/2023 for an elective left total knee arthroplasty performed by Dr. Ernest Pine. Patient was given 1g of TXA and 2g of Ancef prior to the procedure. he  tolerated the procedure well without any complications. See procedural note below for details. Postoperatively, the patient did very well. he  was able to pass PT protocols on post-op day one without any issues. JP drain was removed without any difficulty and was intact. he  was able to void his bladder without any difficulty. Physical exam was unremarkable. he  denies any SOB, CP, N/V, fevers or chills. Vital signs are stable. Patient is stable to discharge home.  PROCEDURE:  Left total knee arthroplasty using computer-assisted navigation   SURGEON:  Jena Gauss. M.D.   ANESTHESIA: spinal   ESTIMATED BLOOD LOSS: 50 mL   FLUIDS REPLACED: 800 mL of crystalloid   TOURNIQUET TIME: 93 minutes   DRAINS: 2 medium Hemovac drains   SOFT TISSUE RELEASES: Anterior cruciate ligament, posterior cruciate ligament, deep medial collateral ligament, patellofemoral ligament   IMPLANTS UTILIZED: DePuy Attune size 6 posterior stabilized femoral component (cemented), size 7 rotating platform tibial component (cemented), 41 mm  medialized dome patella (cemented), and a 10 mm stabilized rotating platform polyethylene insert.  Treatments: none  Discharge Exam: Blood pressure 136/77, pulse 63, temperature 97.8 F (36.6 C), temperature source Oral, resp. rate 16, SpO2 99%.   Disposition: home   Allergies as of 06/22/2023       Reactions   Amlodipine Swelling   feet        Medication List     STOP taking these medications    naproxen sodium 220 MG tablet Commonly known as: ALEVE       TAKE these medications    aspirin 81 MG chewable tablet Chew 1 tablet (81 mg total) by mouth 2 (two) times daily.   benazepril 40 MG tablet Commonly known as: LOTENSIN Take 1 tablet (40 mg total) by mouth every morning.   celecoxib 200 MG capsule Commonly known as: CELEBREX Take 1 capsule (200 mg total) by mouth 2 (two) times daily.   chlorhexidine 4 % external liquid Commonly known as: HIBICLENS Apply 15 mLs (1 Application total) topically as directed for 30 doses. Use as directed daily for 5 days every other week for 6 weeks.   fenofibrate 145 MG tablet Commonly known as: TRICOR Take 145 mg by mouth daily.   Fish Oil 1000 MG Caps Take 1 capsule (1,000 mg total) by mouth daily.   gabapentin 300 MG capsule Commonly known as: NEURONTIN 1 po q hs x 5 days, then increase to 1 po at lunch and 1 po q hs What changed:  how much to take how to take this when to take this additional instructions   hydrochlorothiazide 12.5 MG tablet Commonly known as:  HYDRODIURIL Take 1 tablet (12.5 mg total) by mouth daily.   mupirocin ointment 2 % Commonly known as: BACTROBAN Place 1 Application into the nose 2 (two) times daily for 60 doses. Use as directed 2 times daily for 5 days every other week for 6 weeks.   oxyCODONE 5 MG immediate release tablet Commonly known as: Oxy IR/ROXICODONE Take 1 tablet (5 mg total) by mouth every 4 (four) hours as needed for moderate pain (pain score 4-6) (pain score 4-6).    rosuvastatin 10 MG tablet Commonly known as: CRESTOR Take 1 tablet (10 mg total) by mouth every morning.   traMADol 50 MG tablet Commonly known as: ULTRAM Take 1-2 tablets (50-100 mg total) by mouth every 4 (four) hours as needed for moderate pain (pain score 4-6).               Durable Medical Equipment  (From admission, onward)           Start     Ordered   06/21/23 1144  DME Walker rolling  Once       Question:  Patient needs a walker to treat with the following condition  Answer:  Total knee replacement status   06/21/23 1143   06/21/23 1144  DME Bedside commode  Once       Comments: Patient is not able to walk the distance required to go the bathroom, or he/she is unable to safely negotiate stairs required to access the bathroom.  A 3in1 BSC will alleviate this problem  Question:  Patient needs a bedside commode to treat with the following condition  Answer:  Total knee replacement status   06/21/23 1143            Follow-up Information     [name], PA-C Follow up on 07/05/2023.   Specialty: Orthopedic Surgery Why: at 9:15am Contact information: [address] [place] [address] [phone]         Donato Heinz, MD Follow up on 08/01/2023.   Specialty: Orthopedic Surgery Why: at 9:00am Contact information: [address] [facility] [place] [address] [phone]                 Signed: Gean Birchwood 06/22/2023, 11:21 AM   Objective: Vital signs in last 24 hours: Temp:  [97.6 F (36.4 C)-98.2 F (36.8 C)] 97.8 F (36.6 C) (02/15 0747) Pulse Rate:  [63-88] 63 (02/15 0747) Resp:  [10-23] 16 (02/15 0747) BP: (117-160)/(66-86) 136/77 (02/15 0747) SpO2:  [92 %-99 %] 99 % (02/15 0747)  Intake/Output from previous day:  Intake/Output Summary (Last 24 hours) at 06/22/2023 1121 Last data filed at 06/22/2023 0800 Gross per 24 hour  Intake 607 ml  Output 930 ml  Net -323 ml    Intake/Output  this shift: Total I/O In: -  Out: 240 [Drains:240]  Labs: No results for input(s): "HGB" in the last 72 hours. No results for input(s): "WBC", "RBC", "HCT", "PLT" in the last 72 hours. No results for input(s): "NA", "K", "CL", "CO2", "BUN", "CREATININE", "GLUCOSE", "CALCIUM" in the last 72 hours. No results for input(s): "LABPT", "INR" in the last 72 hours.  EXAM: General - Patient is Alert, Appropriate, and Oriented Extremity - Neurologically intact ABD soft Neurovascular intact Sensation intact distally Intact pulses distally Dorsiflexion/Plantar flexion intact No cellulitis present Compartment soft Dressing - dressing C/D/I and no drainage Motor Function - intact, moving foot and toes well on exam. Able to plantar and dorsi flex with  good strength and ROM.\ Neurovascularly intact to all dermatomes down his LLE. Posterior tibial pulses appreciated JP Drain pulled without difficulty. Intact  Assessment/Plan: 1 Day Post-Op Procedure(s) (LRB): COMPUTER ASSISTED TOTAL KNEE ARTHROPLASTY - RNFA (Left) Procedure(s) (LRB): COMPUTER ASSISTED TOTAL KNEE ARTHROPLASTY - RNFA (Left) Past Medical History:  Diagnosis Date   Arthritis    right knee   Benign neoplasm of transverse colon    Degenerative arthritis of left knee    Dysmetabolic syndrome    Hyperlipidemia    Hypertension    Obesity    Rectal polyp    S/P lumbar fusion    Sleep apnea    does not use cpap   Spinal stenosis    Principal Problem:   History of total knee arthroplasty, left  Estimated body mass index is 42.18 kg/m as calculated from the following:   Height as of 06/17/23: 5\' 10"  (1.778 m).   Weight as of 06/17/23: 133.4 kg.  Patient will continue to work with physical therapy on gait, ROM and strengthening of LLE   Discussed with the patient continuing to utilize Polar Care   Patient will use bone foam in 20-30 minute intervals   Patient will wear TED hose bilaterally to help prevent DVT and clot  formation   Discussed the Aquacel bandage.  This bandage will stay in place 7 days postoperatively.  Can be replaced with honeycomb bandages that will be sent home with the patient   Discussed sending the patient home with tramadol and oxycodone for as needed pain management.  Patient will also be sent home with Celebrex to help with swelling and inflammation.  Patient will take an 81 mg aspirin twice daily for DVT prophylaxis   JP drain removed without difficulty, intact   Weight-Bearing as tolerated to left leg   Patient will [facility] in 2 weeks for staple removal and reevaluation  Diet - Regular diet Follow up - in 2 weeks Activity - WBAT Disposition - Home Condition Upon Discharge - Good DVT Prophylaxis - Aspirin and TED hose  Danise Edge, PA-C Orthopaedic Surgery 06/22/2023, 11:21 AM

## 2023-06-23 DIAGNOSIS — Z96652 Presence of left artificial knee joint: Secondary | ICD-10-CM | POA: Diagnosis not present

## 2023-06-23 DIAGNOSIS — G4730 Sleep apnea, unspecified: Secondary | ICD-10-CM | POA: Diagnosis not present

## 2023-06-23 DIAGNOSIS — M1711 Unilateral primary osteoarthritis, right knee: Secondary | ICD-10-CM | POA: Diagnosis not present

## 2023-06-23 DIAGNOSIS — M4800 Spinal stenosis, site unspecified: Secondary | ICD-10-CM | POA: Diagnosis not present

## 2023-06-23 DIAGNOSIS — Z85038 Personal history of other malignant neoplasm of large intestine: Secondary | ICD-10-CM | POA: Diagnosis not present

## 2023-06-23 DIAGNOSIS — Z981 Arthrodesis status: Secondary | ICD-10-CM | POA: Diagnosis not present

## 2023-06-23 DIAGNOSIS — I10 Essential (primary) hypertension: Secondary | ICD-10-CM | POA: Diagnosis not present

## 2023-06-23 DIAGNOSIS — Z471 Aftercare following joint replacement surgery: Secondary | ICD-10-CM | POA: Diagnosis not present

## 2023-06-23 DIAGNOSIS — Z7982 Long term (current) use of aspirin: Secondary | ICD-10-CM | POA: Diagnosis not present

## 2023-06-23 DIAGNOSIS — Z96642 Presence of left artificial hip joint: Secondary | ICD-10-CM | POA: Diagnosis not present

## 2023-06-23 DIAGNOSIS — Z791 Long term (current) use of non-steroidal anti-inflammatories (NSAID): Secondary | ICD-10-CM | POA: Diagnosis not present

## 2023-06-23 DIAGNOSIS — K5900 Constipation, unspecified: Secondary | ICD-10-CM | POA: Diagnosis not present

## 2023-06-23 DIAGNOSIS — E785 Hyperlipidemia, unspecified: Secondary | ICD-10-CM | POA: Diagnosis not present

## 2023-06-24 ENCOUNTER — Encounter: Payer: Self-pay | Admitting: Orthopedic Surgery

## 2023-06-25 DIAGNOSIS — G4730 Sleep apnea, unspecified: Secondary | ICD-10-CM | POA: Diagnosis not present

## 2023-06-25 DIAGNOSIS — Z471 Aftercare following joint replacement surgery: Secondary | ICD-10-CM | POA: Diagnosis not present

## 2023-06-25 DIAGNOSIS — Z96652 Presence of left artificial knee joint: Secondary | ICD-10-CM | POA: Diagnosis not present

## 2023-06-25 DIAGNOSIS — K5900 Constipation, unspecified: Secondary | ICD-10-CM | POA: Diagnosis not present

## 2023-06-25 DIAGNOSIS — M1711 Unilateral primary osteoarthritis, right knee: Secondary | ICD-10-CM | POA: Diagnosis not present

## 2023-06-25 DIAGNOSIS — Z96642 Presence of left artificial hip joint: Secondary | ICD-10-CM | POA: Diagnosis not present

## 2023-06-25 DIAGNOSIS — Z7982 Long term (current) use of aspirin: Secondary | ICD-10-CM | POA: Diagnosis not present

## 2023-06-25 DIAGNOSIS — I10 Essential (primary) hypertension: Secondary | ICD-10-CM | POA: Diagnosis not present

## 2023-06-25 DIAGNOSIS — Z981 Arthrodesis status: Secondary | ICD-10-CM | POA: Diagnosis not present

## 2023-06-25 DIAGNOSIS — M4800 Spinal stenosis, site unspecified: Secondary | ICD-10-CM | POA: Diagnosis not present

## 2023-06-25 DIAGNOSIS — E785 Hyperlipidemia, unspecified: Secondary | ICD-10-CM | POA: Diagnosis not present

## 2023-06-25 DIAGNOSIS — Z791 Long term (current) use of non-steroidal anti-inflammatories (NSAID): Secondary | ICD-10-CM | POA: Diagnosis not present

## 2023-06-25 DIAGNOSIS — Z85038 Personal history of other malignant neoplasm of large intestine: Secondary | ICD-10-CM | POA: Diagnosis not present

## 2023-06-27 DIAGNOSIS — G4730 Sleep apnea, unspecified: Secondary | ICD-10-CM | POA: Diagnosis not present

## 2023-06-27 DIAGNOSIS — M1711 Unilateral primary osteoarthritis, right knee: Secondary | ICD-10-CM | POA: Diagnosis not present

## 2023-06-27 DIAGNOSIS — Z85038 Personal history of other malignant neoplasm of large intestine: Secondary | ICD-10-CM | POA: Diagnosis not present

## 2023-06-27 DIAGNOSIS — E785 Hyperlipidemia, unspecified: Secondary | ICD-10-CM | POA: Diagnosis not present

## 2023-06-27 DIAGNOSIS — Z7982 Long term (current) use of aspirin: Secondary | ICD-10-CM | POA: Diagnosis not present

## 2023-06-27 DIAGNOSIS — Z791 Long term (current) use of non-steroidal anti-inflammatories (NSAID): Secondary | ICD-10-CM | POA: Diagnosis not present

## 2023-06-27 DIAGNOSIS — I10 Essential (primary) hypertension: Secondary | ICD-10-CM | POA: Diagnosis not present

## 2023-06-27 DIAGNOSIS — Z96652 Presence of left artificial knee joint: Secondary | ICD-10-CM | POA: Diagnosis not present

## 2023-06-27 DIAGNOSIS — Z471 Aftercare following joint replacement surgery: Secondary | ICD-10-CM | POA: Diagnosis not present

## 2023-06-27 DIAGNOSIS — K5900 Constipation, unspecified: Secondary | ICD-10-CM | POA: Diagnosis not present

## 2023-06-27 DIAGNOSIS — Z96642 Presence of left artificial hip joint: Secondary | ICD-10-CM | POA: Diagnosis not present

## 2023-06-27 DIAGNOSIS — M4800 Spinal stenosis, site unspecified: Secondary | ICD-10-CM | POA: Diagnosis not present

## 2023-06-27 DIAGNOSIS — Z981 Arthrodesis status: Secondary | ICD-10-CM | POA: Diagnosis not present

## 2023-07-02 DIAGNOSIS — M4800 Spinal stenosis, site unspecified: Secondary | ICD-10-CM | POA: Diagnosis not present

## 2023-07-02 DIAGNOSIS — E785 Hyperlipidemia, unspecified: Secondary | ICD-10-CM | POA: Diagnosis not present

## 2023-07-02 DIAGNOSIS — Z85038 Personal history of other malignant neoplasm of large intestine: Secondary | ICD-10-CM | POA: Diagnosis not present

## 2023-07-02 DIAGNOSIS — Z981 Arthrodesis status: Secondary | ICD-10-CM | POA: Diagnosis not present

## 2023-07-02 DIAGNOSIS — K5900 Constipation, unspecified: Secondary | ICD-10-CM | POA: Diagnosis not present

## 2023-07-02 DIAGNOSIS — M1711 Unilateral primary osteoarthritis, right knee: Secondary | ICD-10-CM | POA: Diagnosis not present

## 2023-07-02 DIAGNOSIS — Z96642 Presence of left artificial hip joint: Secondary | ICD-10-CM | POA: Diagnosis not present

## 2023-07-02 DIAGNOSIS — G4730 Sleep apnea, unspecified: Secondary | ICD-10-CM | POA: Diagnosis not present

## 2023-07-02 DIAGNOSIS — Z96652 Presence of left artificial knee joint: Secondary | ICD-10-CM | POA: Diagnosis not present

## 2023-07-02 DIAGNOSIS — Z7982 Long term (current) use of aspirin: Secondary | ICD-10-CM | POA: Diagnosis not present

## 2023-07-02 DIAGNOSIS — Z471 Aftercare following joint replacement surgery: Secondary | ICD-10-CM | POA: Diagnosis not present

## 2023-07-02 DIAGNOSIS — Z791 Long term (current) use of non-steroidal anti-inflammatories (NSAID): Secondary | ICD-10-CM | POA: Diagnosis not present

## 2023-07-02 DIAGNOSIS — I10 Essential (primary) hypertension: Secondary | ICD-10-CM | POA: Diagnosis not present

## 2023-07-04 DIAGNOSIS — Z96652 Presence of left artificial knee joint: Secondary | ICD-10-CM | POA: Diagnosis not present

## 2023-07-04 DIAGNOSIS — Z7982 Long term (current) use of aspirin: Secondary | ICD-10-CM | POA: Diagnosis not present

## 2023-07-04 DIAGNOSIS — Z85038 Personal history of other malignant neoplasm of large intestine: Secondary | ICD-10-CM | POA: Diagnosis not present

## 2023-07-04 DIAGNOSIS — Z471 Aftercare following joint replacement surgery: Secondary | ICD-10-CM | POA: Diagnosis not present

## 2023-07-04 DIAGNOSIS — Z981 Arthrodesis status: Secondary | ICD-10-CM | POA: Diagnosis not present

## 2023-07-04 DIAGNOSIS — K5900 Constipation, unspecified: Secondary | ICD-10-CM | POA: Diagnosis not present

## 2023-07-04 DIAGNOSIS — M1711 Unilateral primary osteoarthritis, right knee: Secondary | ICD-10-CM | POA: Diagnosis not present

## 2023-07-04 DIAGNOSIS — Z96642 Presence of left artificial hip joint: Secondary | ICD-10-CM | POA: Diagnosis not present

## 2023-07-04 DIAGNOSIS — I10 Essential (primary) hypertension: Secondary | ICD-10-CM | POA: Diagnosis not present

## 2023-07-04 DIAGNOSIS — M4800 Spinal stenosis, site unspecified: Secondary | ICD-10-CM | POA: Diagnosis not present

## 2023-07-04 DIAGNOSIS — G4730 Sleep apnea, unspecified: Secondary | ICD-10-CM | POA: Diagnosis not present

## 2023-07-04 DIAGNOSIS — Z791 Long term (current) use of non-steroidal anti-inflammatories (NSAID): Secondary | ICD-10-CM | POA: Diagnosis not present

## 2023-07-04 DIAGNOSIS — E785 Hyperlipidemia, unspecified: Secondary | ICD-10-CM | POA: Diagnosis not present

## 2023-07-05 DIAGNOSIS — M25562 Pain in left knee: Secondary | ICD-10-CM | POA: Diagnosis not present

## 2023-07-05 DIAGNOSIS — Z96652 Presence of left artificial knee joint: Secondary | ICD-10-CM | POA: Diagnosis not present

## 2023-07-09 DIAGNOSIS — Z96652 Presence of left artificial knee joint: Secondary | ICD-10-CM | POA: Diagnosis not present

## 2023-07-09 DIAGNOSIS — M25562 Pain in left knee: Secondary | ICD-10-CM | POA: Diagnosis not present

## 2023-07-12 DIAGNOSIS — M25562 Pain in left knee: Secondary | ICD-10-CM | POA: Diagnosis not present

## 2023-07-12 DIAGNOSIS — Z96652 Presence of left artificial knee joint: Secondary | ICD-10-CM | POA: Diagnosis not present

## 2023-07-16 DIAGNOSIS — M25562 Pain in left knee: Secondary | ICD-10-CM | POA: Diagnosis not present

## 2023-07-16 DIAGNOSIS — Z96652 Presence of left artificial knee joint: Secondary | ICD-10-CM | POA: Diagnosis not present

## 2023-07-18 DIAGNOSIS — Z96652 Presence of left artificial knee joint: Secondary | ICD-10-CM | POA: Diagnosis not present

## 2023-07-24 DIAGNOSIS — Z96652 Presence of left artificial knee joint: Secondary | ICD-10-CM | POA: Diagnosis not present

## 2023-07-24 DIAGNOSIS — M25562 Pain in left knee: Secondary | ICD-10-CM | POA: Diagnosis not present

## 2023-08-01 DIAGNOSIS — Z96652 Presence of left artificial knee joint: Secondary | ICD-10-CM | POA: Diagnosis not present

## 2023-09-03 ENCOUNTER — Other Ambulatory Visit: Payer: Self-pay | Admitting: Family Medicine

## 2023-09-03 DIAGNOSIS — E782 Mixed hyperlipidemia: Secondary | ICD-10-CM

## 2023-09-05 NOTE — Telephone Encounter (Signed)
 Requested medication (s) are due for refill today: Yes  Requested medication (s) are on the active medication list: Yes  Last refill:  06/17/23  Future visit scheduled: No  Notes to clinic:  Unable to refill per protocol, last refill by another provider.      Requested Prescriptions  Pending Prescriptions Disp Refills   fenofibrate  (TRICOR ) 145 MG tablet [Pharmacy Med Name: FENOFIBRATE  145 MG TABLET] 90 tablet     Sig: TAKE 1 TABLET BY MOUTH EVERY DAY     Cardiovascular:  Antilipid - Fibric Acid Derivatives Failed - 09/05/2023  2:51 PM      Failed - Lipid Panel in normal range within the last 12 months    Cholesterol, Total  Date Value Ref Range Status  06/18/2023 198 100 - 199 mg/dL Final   LDL Chol Calc (NIH)  Date Value Ref Range Status  06/18/2023 118 (H) 0 - 99 mg/dL Final   HDL  Date Value Ref Range Status  06/18/2023 52 >39 mg/dL Final   Triglycerides  Date Value Ref Range Status  06/18/2023 162 (H) 0 - 149 mg/dL Final         Passed - ALT in normal range and within 360 days    ALT  Date Value Ref Range Status  06/14/2023 21 0 - 44 U/L Final         Passed - AST in normal range and within 360 days    AST  Date Value Ref Range Status  06/14/2023 18 15 - 41 U/L Final         Passed - Cr in normal range and within 360 days    Creatinine  Date Value Ref Range Status  08/10/2011 0.74 0.60 - 1.30 mg/dL Final   Creatinine, Ser  Date Value Ref Range Status  06/18/2023 1.07 0.76 - 1.27 mg/dL Final         Passed - HGB in normal range and within 360 days    Hemoglobin  Date Value Ref Range Status  06/14/2023 15.5 13.0 - 17.0 g/dL Final   HGB  Date Value Ref Range Status  08/10/2011 12.9 (L) 13.0 - 18.0 g/dL Final         Passed - HCT in normal range and within 360 days    HCT  Date Value Ref Range Status  06/14/2023 46.1 39.0 - 52.0 % Final  07/23/2011 46.1 40.0 - 52.0 % Final         Passed - PLT in normal range and within 360 days    Platelets   Date Value Ref Range Status  06/14/2023 273 150 - 400 K/uL Final   Platelet  Date Value Ref Range Status  08/10/2011 209 150 - 440 x10 3/mm 3 Final         Passed - WBC in normal range and within 360 days    WBC  Date Value Ref Range Status  06/14/2023 7.7 4.0 - 10.5 K/uL Final         Passed - eGFR is 30 or above and within 360 days    EGFR (African American)  Date Value Ref Range Status  08/10/2011 >60 >106mL/min Final   GFR calc Af Amer  Date Value Ref Range Status  06/10/2020 100 >59 mL/min/1.73 Final    Comment:    **In accordance with recommendations from the NKF-ASN Task force,**   [facility] is in the process of updating its eGFR calculation to the   2021 CKD-EPI creatinine equation that estimates  kidney function   without a race variable.    EGFR (Non-African Amer.)  Date Value Ref Range Status  08/10/2011 >60 >8mL/min Final    Comment:    eGFR values <13mL/min/1.73 m2 may be an indication of chronic kidney disease (CKD). Calculated eGFR, using the MRDR Study equation, is useful in  patients with stable renal function. The eGFR calculation will not be reliable in acutely ill patients when serum creatinine is changing rapidly. It is not useful in patients on dialysis. The eGFR calculation may not be applicable to patients at the low and high extremes of body sizes, pregnant women, and vegetarians.    GFR, Estimated  Date Value Ref Range Status  06/14/2023 >60 >60 mL/min Final    Comment:    (NOTE) Calculated using the CKD-EPI Creatinine Equation (2021)    eGFR  Date Value Ref Range Status  06/18/2023 77 >59 mL/min/1.73 Final         Passed - Valid encounter within last 12 months    Recent Outpatient Visits           2 months ago Essential hypertension    [facility] Kayla Part, MD

## 2023-09-16 ENCOUNTER — Other Ambulatory Visit: Payer: Self-pay | Admitting: Orthopedic Surgery

## 2023-09-16 DIAGNOSIS — M4316 Spondylolisthesis, lumbar region: Secondary | ICD-10-CM

## 2023-09-16 DIAGNOSIS — M5416 Radiculopathy, lumbar region: Secondary | ICD-10-CM

## 2023-09-16 DIAGNOSIS — Z981 Arthrodesis status: Secondary | ICD-10-CM

## 2023-09-20 ENCOUNTER — Other Ambulatory Visit: Payer: Self-pay

## 2023-09-20 ENCOUNTER — Encounter
Admission: RE | Admit: 2023-09-20 | Discharge: 2023-09-20 | Disposition: A | Discharge status: Home / Self Care | Source: Ambulatory Visit | Attending: Orthopedic Surgery | Admitting: Orthopedic Surgery

## 2023-09-20 VITALS — BP 133/84 | HR 84 | Resp 16 | Ht 70.0 in | Wt 285.2 lb

## 2023-09-20 DIAGNOSIS — E8881 Metabolic syndrome: Secondary | ICD-10-CM | POA: Insufficient documentation

## 2023-09-20 DIAGNOSIS — Z01818 Encounter for other preprocedural examination: Secondary | ICD-10-CM

## 2023-09-20 DIAGNOSIS — M1711 Unilateral primary osteoarthritis, right knee: Secondary | ICD-10-CM | POA: Insufficient documentation

## 2023-09-20 DIAGNOSIS — Z01812 Encounter for preprocedural laboratory examination: Secondary | ICD-10-CM | POA: Insufficient documentation

## 2023-09-20 DIAGNOSIS — K621 Rectal polyp: Secondary | ICD-10-CM | POA: Diagnosis not present

## 2023-09-20 LAB — COMPREHENSIVE METABOLIC PANEL WITH GFR
ALT: 24 U/L (ref 0–44)
AST: 21 U/L (ref 15–41)
Albumin: 4.1 g/dL (ref 3.5–5.0)
Alkaline Phosphatase: 45 U/L (ref 38–126)
Anion gap: 10 (ref 5–15)
BUN: 21 mg/dL (ref 8–23)
CO2: 23 mmol/L (ref 22–32)
Calcium: 9.9 mg/dL (ref 8.9–10.3)
Chloride: 104 mmol/L (ref 98–111)
Creatinine, Ser: 0.95 mg/dL (ref 0.61–1.24)
GFR, Estimated: >60 mL/min (ref >60)
Glucose, Bld: 111 mg/dL — ABNORMAL HIGH (ref 70–99)
Potassium: 4 mmol/L (ref 3.5–5.1)
Sodium: 137 mmol/L (ref 135–145)
Total Bilirubin: 0.8 mg/dL (ref 0.0–1.2)
Total Protein: 7.7 g/dL (ref 6.5–8.1)

## 2023-09-20 LAB — SURGICAL PCR SCREEN
MRSA, PCR: NEGATIVE
Staphylococcus aureus: NEGATIVE

## 2023-09-20 LAB — CBC
HCT: 46 % (ref 39.0–52.0)
Hemoglobin: 15.7 g/dL (ref 13.0–17.0)
MCH: 32.2 pg (ref 26.0–34.0)
MCHC: 34.1 g/dL (ref 30.0–36.0)
MCV: 94.3 fL (ref 80.0–100.0)
Platelets: 271 10*3/uL (ref 150–400)
RBC: 4.88 MIL/uL (ref 4.22–5.81)
RDW: 12.3 % (ref 11.5–15.5)
WBC: 8.7 10*3/uL (ref 4.0–10.5)
nRBC: 0 % (ref 0.0–0.2)

## 2023-09-20 LAB — URINALYSIS, ROUTINE W REFLEX MICROSCOPIC
Bilirubin Urine: NEGATIVE
Glucose, UA: NEGATIVE mg/dL
Hgb urine dipstick: NEGATIVE
Ketones, ur: NEGATIVE mg/dL
Leukocytes,Ua: NEGATIVE
Nitrite: NEGATIVE
Protein, ur: NEGATIVE mg/dL
Specific Gravity, Urine: 1.019 (ref 1.005–1.030)
pH: 5 (ref 5.0–8.0)

## 2023-09-20 LAB — C-REACTIVE PROTEIN: CRP: 0.8 mg/dL (ref <1.0)

## 2023-09-20 LAB — SEDIMENTATION RATE: Sed Rate: 5 mm/h (ref 0–20)

## 2023-09-20 NOTE — Discharge Instructions (Addendum)
 Instructions after Total Knee Replacement   James P. Angie Fava., M.D.    Dept. of Orthopaedics & Sports Medicine [facility] [address] [place], [place]  [address]  Phone: [phone]   Fax: [phone]       [email or web address]       DIET: Drink plenty of non-alcoholic fluids. Resume your normal diet. Include foods high in fiber.  ACTIVITY:  You may use crutches or a walker with weight-bearing as tolerated, unless instructed otherwise. You may be weaned off of the walker or crutches by your Physical Therapist.  Do NOT place pillows under the knee. Anything placed under the knee could limit your ability to straighten the knee.   Use the Bone Foam 3 times a day for 30 minutes each session to help straighten the knee. Continue doing gentle exercises. Exercising will reduce the pain and swelling, increase motion, and prevent muscle weakness.   Please continue to use the TED compression stockings for 6 weeks. You may remove the stockings at night, but should reapply them in the morning. Do not drive or operate any equipment until instructed.  WOUND CARE:  The initial dressing (Aquacel) can remain in place for 7 days (see separate instructions). Continue to use the PolarCare or ice packs periodically to reduce pain and swelling. You may bathe or shower after the staples are removed at the first office visit following surgery.  MEDICATIONS: You may resume your regular medications. Please take the pain medication as prescribed on the medication. Do not take pain medication on an empty stomach. Unless instructed otherwise, you should take an enteric-coated aspirin 81 mg. TWICE a day. (This along with elevation will help reduce the possibility of blood clots/phlebitis in your operated leg.) Use a stool softener (such as Senokot-S or Colace) daily and a laxative (such as Miralax or Dulcolax) as needed to prevent constipation.  Do not drive or drink alcoholic beverages when  taking pain medications.  CALL THE OFFICE FOR: Temperature above 101 degrees Excessive bleeding or drainage on the dressing. Excessive swelling, coldness, or paleness of the toes. Persistent nausea and vomiting.  FOLLOW-UP:  You should have an appointment to return to the office in 10-14 days after surgery. Arrangements have been made for continuation of Physical Therapy (either home therapy or outpatient therapy).     [facility] Department Directory         [email or web address]       [email or web address]          Cardiology  Appointments: [place] - [phone]  Endocrinology  Appointments: [place] [phone] [place] - [phone]  Gastroenterology  Appointments: [place] [phone] [place] - [phone]        General Surgery   Appointments: [facility]  Internal Medicine/Family Medicine  Appointments: [facility] [place] - [phone] [place] - [phone]  Metabolic and Weigh Loss Surgery  Appointments: [facility]        Neurology  Appointments: [place] [phone] [place] - [phone]  Neurosurgery  Appointments: [place]  Obstetrics & Gynecology  Appointments: [place] [phone] [place] - [phone]        Pediatrics  Appointments: [place] [phone] [place] - [phone]  Physiatry  Appointments: [place] [phone]  Physical Therapy  Appointments: [place] - [phone]        Podiatry  Appointments: [place] [phone] [place] - [phone]  Pulmonology  Appointments: [place]  Rheumatology  Appointments: [place] [phone]  [place] Location: [facility]  [address] [place], [place]  [address]  [place] Location: [facility]. [address] [place], [place]  [address]  [place]  Location: [facility] [address] [place], [place]  [address]     [employer]  [address] , [place], [place], [address]. [phone] They will call you to arrange when they can come to see you

## 2023-09-20 NOTE — Patient Instructions (Addendum)
 Your procedure is scheduled on:10-02-23 Wednesday Report to the Registration Desk on the 1st floor of the Medical Mall.Then proceed to the 2nd floor Surgery Desk To find out your arrival time, please call [phone] between 1PM - 3PM on:10-01-23 Tuesday If your arrival time is 6:00 am, do not arrive before that time as the Medical Mall entrance doors do not open until 6:00 am.  REMEMBER: Instructions that are not followed completely may result in serious medical risk, up to and including death; or upon the discretion of your surgeon and anesthesiologist your surgery may need to be rescheduled.  Do not eat food after midnight the night before surgery.  No gum chewing or hard candies.  You may however, drink CLEAR liquids up to 2 hours before you are scheduled to arrive for your surgery. Do not drink anything within 2 hours of your scheduled arrival time.  Clear liquids include: - water  - apple juice without pulp - gatorade (not RED colors) - black coffee or tea (Do NOT add milk or creamers to the coffee or tea) Do NOT drink anything that is not on this list.  In addition, your doctor has ordered for you to drink the provided:  Ensure Pre-Surgery Clear Carbohydrate Drink  Drinking this carbohydrate drink up to two hours before surgery helps to reduce insulin resistance and improve patient outcomes. Please complete drinking 2 hours before scheduled arrival time.  One week prior to surgery::Last dose will be on 09-24-23  Stop Anti-inflammatories (NSAIDS) such as Advil, Aleve, Ibuprofen, Motrin, Naproxen, Naprosyn and Aspirin  based products such as Excedrin, Goody's Powder, BC Powder. Stop ANY OVER THE COUNTER supplements until after surgery (Fish Oil )  You may however, continue to take Tylenol  if needed for pain up until the day of surgery.  Continue taking all of your other prescription medications up until the day of surgery.  ON THE DAY OF SURGERY ONLY TAKE THESE MEDICATIONS WITH  SIPS OF WATER: -fenofibrate  (TRICOR )  -rosuvastatin  (CRESTOR )   Continue your 81 mg Aspirin  up until the day prior to surgery-Do NOT take the morning of surgery  No Alcohol for 24 hours before or after surgery.  No Smoking including e-cigarettes for 24 hours before surgery.  No chewable tobacco products for at least 6 hours before surgery.  No nicotine patches on the day of surgery.  Do not use any "recreational" drugs for at least a week (preferably 2 weeks) before your surgery.  Please be advised that the combination of cocaine and anesthesia may have negative outcomes, up to and including death. If you test positive for cocaine, your surgery will be cancelled.  On the morning of surgery brush your teeth with toothpaste and water, you may rinse your mouth with mouthwash if you wish. Do not swallow any toothpaste or mouthwash.  Use CHG Soap as directed on instruction sheet.  Do not wear jewelry, make-up, hairpins, clips or nail polish.  For welded (permanent) jewelry: bracelets, anklets, waist bands, etc.  Please have this removed prior to surgery.  If it is not removed, there is a chance that hospital personnel will need to cut it off on the day of surgery.  Do not wear lotions, powders, or perfumes.   Do not shave body hair from the neck down 48 hours before surgery.  Contact lenses, hearing aids and dentures may not be worn into surgery.  Do not bring valuables to the hospital. [facility] is not responsible for any missing/lost belongings or valuables.  Notify your doctor if there is any change in your medical condition (cold, fever, infection).  Wear comfortable clothing (specific to your surgery type) to the hospital.  After surgery, you can help prevent lung complications by doing breathing exercises.  Take deep breaths and cough every 1-2 hours. Your doctor may order a device called an Incentive Spirometer to help you take deep breaths. When coughing or sneezing, hold  a pillow firmly against your incision with both hands. This is called "splinting." Doing this helps protect your incision. It also decreases belly discomfort.  If you are being admitted to the hospital overnight, leave your suitcase in the car. After surgery it may be brought to your room.  In case of increased patient census, it may be necessary for you, the patient, to continue your postoperative care in the Same Day Surgery department.  If you are being discharged the day of surgery, you will not be allowed to drive home. You will need a responsible individual to drive you home and stay with you for 24 hours after surgery.   If you are taking public transportation, you will need to have a responsible individual with you.  Please call the Pre-admissions Testing Dept. at [phone] if you have any questions about these instructions.  Surgery Visitation Policy:  Patients having surgery or a procedure may have two visitors.  Children under the age of 18 must have an adult with them who is not the patient.  Inpatient Visitation:    Visiting hours are 7 a.m. to 8 p.m. Up to four visitors are allowed at one time in a patient room. The visitors may rotate out with other people during the day.  One visitor age 49 or older may stay with the patient overnight and must be in the room by 8 p.m.    Pre-operative 5 CHG Bath Instructions   You can play a key role in reducing the risk of infection after surgery. Your skin needs to be as free of germs as possible. You can reduce the number of germs on your skin by washing with CHG (chlorhexidine  gluconate) soap before surgery. CHG is an antiseptic soap that kills germs and continues to kill germs even after washing.   DO NOT use if you have an allergy to chlorhexidine /CHG or antibacterial soaps. If your skin becomes reddened or irritated, stop using the CHG and notify one of our RNs at [phone].   Please shower with the CHG soap starting 4  days before surgery using the following schedule:     Please keep in mind the following:  DO NOT shave, including legs and underarms, starting the day of your first shower.   You may shave your face at any point before/day of surgery.  Place clean sheets on your bed the day you start using CHG soap. Use a clean washcloth (not used since being washed) for each shower. DO NOT sleep with pets once you start using the CHG.   CHG Shower Instructions:  If you choose to wash your hair and private area, wash first with your normal shampoo/soap.  After you use shampoo/soap, rinse your hair and body thoroughly to remove shampoo/soap residue.  Turn the water OFF and apply about 3 tablespoons (45 ml) of CHG soap to a CLEAN washcloth.  Apply CHG soap ONLY FROM YOUR NECK DOWN TO YOUR TOES (washing for 3-5 minutes)  DO NOT use CHG soap on face, private areas, open wounds, or sores.  Pay special attention to  the area where your surgery is being performed.  If you are having back surgery, having someone wash your back for you may be helpful. Wait 2 minutes after CHG soap is applied, then you may rinse off the CHG soap.  Pat dry with a clean towel  Put on clean clothes/pajamas   If you choose to wear lotion, please use ONLY the CHG-compatible lotions on the back of this paper.     Additional instructions for the day of surgery: DO NOT APPLY any lotions, deodorants, cologne, or perfumes.   Put on clean/comfortable clothes.  Brush your teeth.  Ask your nurse before applying any prescription medications to the skin.      CHG Compatible Lotions   Aveeno Moisturizing lotion  Cetaphil Moisturizing Cream  Cetaphil Moisturizing Lotion  Clairol Herbal Essence Moisturizing Lotion, Dry Skin  Clairol Herbal Essence Moisturizing Lotion, Extra Dry Skin  Clairol Herbal Essence Moisturizing Lotion, Normal Skin  Curel Age Defying Therapeutic Moisturizing Lotion with Alpha Hydroxy  Curel Extreme Care Body  Lotion  Curel Soothing Hands Moisturizing Hand Lotion  Curel Therapeutic Moisturizing Cream, Fragrance-Free  Curel Therapeutic Moisturizing Lotion, Fragrance-Free  Curel Therapeutic Moisturizing Lotion, Original Formula  Eucerin Daily Replenishing Lotion  Eucerin Dry Skin Therapy Plus Alpha Hydroxy Crme  Eucerin Dry Skin Therapy Plus Alpha Hydroxy Lotion  Eucerin Original Crme  Eucerin Original Lotion  Eucerin Plus Crme Eucerin Plus Lotion  Eucerin TriLipid Replenishing Lotion  Keri Anti-Bacterial Hand Lotion  Keri Deep Conditioning Original Lotion Dry Skin Formula Softly Scented  Keri Deep Conditioning Original Lotion, Fragrance Free Sensitive Skin Formula  Keri Lotion Fast Absorbing Fragrance Free Sensitive Skin Formula  Keri Lotion Fast Absorbing Softly Scented Dry Skin Formula  Keri Original Lotion  Keri Skin Renewal Lotion Keri Silky Smooth Lotion  Keri Silky Smooth Sensitive Skin Lotion  Nivea Body Creamy Conditioning Oil  Nivea Body Extra Enriched Lotion  Nivea Body Original Lotion  Nivea Body Sheer Moisturizing Lotion Nivea Crme  Nivea Skin Firming Lotion  NutraDerm 30 Skin Lotion  NutraDerm Skin Lotion  NutraDerm Therapeutic Skin Cream  NutraDerm Therapeutic Skin Lotion  ProShield Protective Hand Cream  Provon moisturizing lotion  How to Use an Incentive Spirometer An incentive spirometer is a tool that measures how well you are filling your lungs with each breath. Learning to take long, deep breaths using this tool can help you keep your lungs clear and active. This may help to reverse or lessen your chance of developing breathing (pulmonary) problems, especially infection. You may be asked to use a spirometer: After a surgery. If you have a lung problem or a history of smoking. After a long period of time when you have been unable to move or be active. If the spirometer includes an indicator to show the highest number that you have reached, your health care  provider or respiratory therapist will help you set a goal. Keep a log of your progress as told by your health care provider. What are the risks? Breathing too quickly may cause dizziness or cause you to pass out. Take your time so you do not get dizzy or light-headed. If you are in pain, you may need to take pain medicine before doing incentive spirometry. It is harder to take a deep breath if you are having pain. How to use your incentive spirometer  Sit up on the edge of your bed or on a chair. Hold the incentive spirometer so that it is in an upright position. Before you  use the spirometer, breathe out normally. Place the mouthpiece in your mouth. Make sure your lips are closed tightly around it. Breathe in slowly and as deeply as you can through your mouth, causing the piston or the ball to rise toward the top of the chamber. Hold your breath for 3-5 seconds, or for as long as possible. If the spirometer includes a coach indicator, use this to guide you in breathing. Slow down your breathing if the indicator goes above the marked areas. Remove the mouthpiece from your mouth and breathe out normally. The piston or ball will return to the bottom of the chamber. Rest for a few seconds, then repeat the steps 10 or more times. Take your time and take a few normal breaths between deep breaths so that you do not get dizzy or light-headed. Do this every 1-2 hours when you are awake. If the spirometer includes a goal marker to show the highest number you have reached (best effort), use this as a goal to work toward during each repetition. After each set of 10 deep breaths, cough a few times. This will help to make sure that your lungs are clear. If you have an incision on your chest or abdomen from surgery, place a pillow or a rolled-up towel firmly against the incision when you cough. This can help to reduce pain while taking deep breaths and coughing. General tips When you are able to get out of  bed: Walk around often. Continue to take deep breaths and cough in order to clear your lungs. Keep using the incentive spirometer until your health care provider says it is okay to stop using it. If you have been in the hospital, you may be told to keep using the spirometer at home. Contact a health care provider if: You are having difficulty using the spirometer. You have trouble using the spirometer as often as instructed. Your pain medicine is not giving enough relief for you to use the spirometer as told. You have a fever. Get help right away if: You develop shortness of breath. You develop a cough with bloody mucus from the lungs. You have fluid or blood coming from an incision site after you cough. Summary An incentive spirometer is a tool that can help you learn to take long, deep breaths to keep your lungs clear and active. You may be asked to use a spirometer after a surgery, if you have a lung problem or a history of smoking, or if you have been inactive for a long period of time. Use your incentive spirometer as instructed every 1-2 hours while you are awake. If you have an incision on your chest or abdomen, place a pillow or a rolled-up towel firmly against your incision when you cough. This will help to reduce pain. Get help right away if you have shortness of breath, you cough up bloody mucus, or blood comes from your incision when you cough. This information is not intended to replace advice given to you by your health care provider. Make sure you discuss any questions you have with your health care provider. Document Revised: 03/01/2023 Document Reviewed: 03/01/2023 Elsevier Patient Education  2024 Elsevier Inc.  Preoperative Educational Videos for Total Hip, Knee and Shoulder Replacements  To better prepare for surgery, please view our videos that explain the physical activity and discharge planning required to have the best surgical recovery at [facility].  [email or web address]  Questions? Call [phone] or email [email or web address]

## 2023-09-21 ENCOUNTER — Other Ambulatory Visit: Payer: Self-pay | Admitting: Family Medicine

## 2023-09-21 DIAGNOSIS — I10 Essential (primary) hypertension: Secondary | ICD-10-CM

## 2023-09-23 ENCOUNTER — Telehealth: Payer: Self-pay

## 2023-09-23 DIAGNOSIS — Z96652 Presence of left artificial knee joint: Secondary | ICD-10-CM | POA: Diagnosis not present

## 2023-09-23 DIAGNOSIS — M1711 Unilateral primary osteoarthritis, right knee: Secondary | ICD-10-CM | POA: Diagnosis not present

## 2023-09-23 NOTE — Telephone Encounter (Signed)
 Copied from CRM [id number]. Topic: Clinical - Prescription Issue >> Sep 20, 2023  2:25 PM Sophia H wrote: Reason for CRM: Pt is needing to refill his fenofibrate  (TRICOR ) 145 MG tablet, he is unsure if Dr. Rochelle Chu prescribed it but needs to know if he can have it filled. He is out of this medication, [facility] [phone] [place], [place] - [address] [place] [address]   Please advise patient - [phone]

## 2023-09-26 ENCOUNTER — Other Ambulatory Visit: Payer: Self-pay

## 2023-09-26 DIAGNOSIS — E782 Mixed hyperlipidemia: Secondary | ICD-10-CM

## 2023-09-26 MED ORDER — FENOFIBRATE 145 MG PO TABS: 145.0000 mg | ORAL_TABLET | ORAL | 0 refills | Status: AC

## 2023-09-26 NOTE — Telephone Encounter (Signed)
Completed/ jm
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
## 2023-10-01 ENCOUNTER — Encounter: Payer: Self-pay | Admitting: Orthopedic Surgery

## 2023-10-01 NOTE — H&P (Signed)
 ORTHOPAEDIC HISTORY & PHYSICAL Luz Sames, [place] - 09/23/2023 3:45 PM EDT Formatting of this note is different from the original. NAME: Dillan Tomich H&P Date: 09/23/2023 Procedure Date: 10/02/2023  Chief Complaint: right knee pain, swelling, and stiffness  HPI Jahmir Salo is a 68 y.o. male who has severe Right knee pain. Patient has a recent left total knee arthroplasty that was performed by Dr. Aubry Blase in February of this year. He presents today to discuss surgical intervention for his right knee. He states that he has had a long standing history of right knee discomfort. He localizes most of the pain along the medial aspect of his knee. He does report intermittent swelling and giving way of the knee. Denies any feelings of locking. He has failed conservative treatment including NSAID's, injections, PT, and activity modification. He has requested operative intervention for relief of his DJD symptoms. He denies any previous cardiac or pulmonary history. No clots or DVTs. Patient is not a diabetic. Denies any previous surgeries on this lower extremity. He is very happy with the outcome of his left knee surgery, and is looking forward to having his right knee replaced.  Social Hx: Patient lives at home with his wife who will be helping look after him postsurgically. He is retired. Denies any illicit drug use, nicotine or smoking. He does admit to drinking alcohol, states 1 beer every once in a while, but states that he recently decided that he wanted to quit alcohol consumption last week.  Medications & Allergies Allergies: Allergies Allergen Reactions Amlodipine Swelling Feet swelling  feet  Home Medicines: Current Outpatient Medications on File Prior to Visit Medication Sig Dispense Refill acetaminophen  (TYLENOL ) 500 MG tablet Take 500 mg by mouth every 12 (twelve) hours as needed for Pain Take 2 tablets by mouth twice a day as needed. aspirin  81 MG chewable tablet Take 81  mg by mouth 2 (two) times daily benazepril  (LOTENSIN ) 40 MG tablet Take 40 mg by mouth once daily. celecoxib  (CELEBREX ) 200 MG capsule docosahexaenoic acid-epa 120-180 mg Cap Take 1,000 mg by mouth once daily fenofibrate  nanocrystallized (TRICOR ) 145 MG tablet Take 145 mg by mouth once daily gabapentin  (NEURONTIN ) 300 MG capsule Take 300 mg by mouth hydroCHLOROthiazide  (HYDRODIURIL ) 12.5 MG tablet Take 12.5 mg by mouth once daily mupirocin  (BACTROBAN ) 2 % ointment PLACE 1 APPLICATION INTO THE NOSE 2 (TWO) TIMES DAILY FOR 60 DOSES. USE AS DIRECTED 2 TIMES DAILY FOR 5 DAYS EVERY OTHER WEEK FOR 6 WEEKS. 44 g 1 naproxen sodium (ALEVE) 220 MG tablet Take 440 mg by mouth once daily as needed for Pain rosuvastatin  (CRESTOR ) 10 MG tablet Take 1 tablet by mouth once daily tiZANidine (ZANAFLEX) 2 MG tablet TAKE 1 TABLET BY MOUTH TWICE A DAY 30 tablet 0  No current facility-administered medications on file prior to visit.  Medical / Surgical History  Past Medical History: Diagnosis Date Hypertension   Past Surgical History: Procedure Laterality Date Left total hip arthroplasty 08/08/2011 Dr Aubry Blase Right knee arthroscopy, partial medial meniscectomy and chondroplasty of the medial, lateral and patellofemoral compartments. 07/17/2013 Dr Aubry Blase Left total knee arthroplasty using computer-assisted 06/21/2023 Dr Aubry Blase   Physical Exam  Ht:177.8 cm (5\' 10" ) Wt:(!) 128.8 kg (284 lb) BMI: Body mass index is 40.75 kg/m.  General/Constitutional: No apparent distress: well-nourished and well developed. Eyes: Pupils equal, round with synchronous movement. Lymphatic: No palpable adenopathy. Respiratory: Patient has good chest rise and fall with inspiration and expiration. All lung fields are clear to auscultation bilaterally. There is  no Rales, rhonchi or wheezes appreciated. Cardiovascular: Upon auscultation there is a regular rate and rhythm without any murmurs, rubs, gallops or heaves appreciated.  There does not appear to be any swelling down the lower extremities. Posterior tibial pulses appreciated bilaterally, 2+. Integumentary: No impressive skin lesions present, except as noted in detailed exam. Neuro/Psych: Normal mood and affect, oriented to person, place and time. Musculoskeletal: see exam below  Right knee exam Upon inspection of the patient's right knee there does not appear to be any skin changes, open abrasions, swelling or redness. There is a varus alignment. Upon palpation, the patient reports having pain along the medial aspect of their knee. Patient has a few degrees off of full extension actively with ROM, and able to flex back to 113 degrees with mild pain. Varus and valgus stress testing shows positive pseudolaxity to valgus stressing. The patella tracks well within the femoral groove from flexion into extension with noticeable crepitus. Anterior and posterior drawer testing negative. Patient is neurovascularly intact down their lower extremity to all dermatomes. Posterior tibial pulses appreciated 2+.  Imaging right Knee Imaging: A series of x-rays were ordered and interpreted of the patient's right knee. These images included AP weightbearing, lateral and sunrise views. Upon inspection, there is noticeable closure of the medial cartilage space with complete bone-on-bone articulation noted. Subchondral sclerosis is noted. Osteophyte formation is present. Overall alignment is relative varus. No fractures, lytic lesions or gross deformities appreciated on films.  Assesment and Plan Knee DJD  I have recommended that [facility] undergo right total knee replacement. Consents has been signed. The risks, benefits, prognosis and alternatives including but not limited to DVT, PE, infection, neurovascular injury, failure of the procedure and death were explained to the patient and he is willing to proceed with surgery as described to him by myself. Plan will be for post  operative admission of at least 1 midnight for pain control and PT. He will be managed with DVT prophylaxis, antibiotics preoperatively for 24 hours and aggressive in patient rehab.  Pre, intra and post op interventions were discussed. Patient has good understanding  Medication Reconciliation was performed. Discussed cessation of NSAIDs, vitamins and supplements.  A total of 40 minutes was spent reviewing patient's charts, medical reconciliation, discussing/educating the patient about surgical interventions, and answering any questions provided by the patient.  JOSHUA Alex Hylan, PA [facility] 09/23/2023  Electronically signed by Luz Sames, PA at 09/23/2023 4:33 PM EDT

## 2023-10-02 ENCOUNTER — Observation Stay
Admission: RE | Admit: 2023-10-02 | Discharge: 2023-10-03 | Disposition: A | Discharge status: Home Health | Source: Ambulatory Visit | Attending: Orthopedic Surgery | Admitting: Orthopedic Surgery

## 2023-10-02 ENCOUNTER — Other Ambulatory Visit: Payer: Self-pay

## 2023-10-02 ENCOUNTER — Ambulatory Visit: Admitting: Anesthesiology

## 2023-10-02 ENCOUNTER — Encounter
Admission: RE | Disposition: A | Discharge status: Home Health | Payer: Self-pay | Source: Ambulatory Visit | Attending: Orthopedic Surgery

## 2023-10-02 ENCOUNTER — Ambulatory Visit: Payer: Self-pay | Admitting: Urgent Care

## 2023-10-02 ENCOUNTER — Encounter: Payer: Self-pay | Admitting: Orthopedic Surgery

## 2023-10-02 ENCOUNTER — Observation Stay

## 2023-10-02 DIAGNOSIS — I10 Essential (primary) hypertension: Secondary | ICD-10-CM | POA: Diagnosis not present

## 2023-10-02 DIAGNOSIS — Z79899 Other long term (current) drug therapy: Secondary | ICD-10-CM | POA: Diagnosis not present

## 2023-10-02 DIAGNOSIS — E8881 Metabolic syndrome: Secondary | ICD-10-CM

## 2023-10-02 DIAGNOSIS — Z96652 Presence of left artificial knee joint: Secondary | ICD-10-CM | POA: Diagnosis not present

## 2023-10-02 DIAGNOSIS — Z7982 Long term (current) use of aspirin: Secondary | ICD-10-CM | POA: Insufficient documentation

## 2023-10-02 DIAGNOSIS — Z96651 Presence of right artificial knee joint: Secondary | ICD-10-CM | POA: Diagnosis not present

## 2023-10-02 DIAGNOSIS — M1711 Unilateral primary osteoarthritis, right knee: Secondary | ICD-10-CM | POA: Diagnosis not present

## 2023-10-02 DIAGNOSIS — Z96642 Presence of left artificial hip joint: Secondary | ICD-10-CM | POA: Insufficient documentation

## 2023-10-02 DIAGNOSIS — K621 Rectal polyp: Secondary | ICD-10-CM

## 2023-10-02 HISTORY — PX: KNEE ARTHROPLASTY: SHX992

## 2023-10-02 SURGERY — ARTHROPLASTY, KNEE, TOTAL, USING IMAGELESS COMPUTER-ASSISTED NAVIGATION
Anesthesia: Spinal | Site: Knee | Laterality: Right

## 2023-10-02 MED ORDER — METOCLOPRAMIDE HCL 10 MG PO TABS
10.0000 mg | ORAL_TABLET | Freq: Three times a day (TID) | ORAL | Status: DC
  Administered 2023-10-02 – 2023-10-03 (×2): 10 mg via ORAL
  Filled 2023-10-02 (×2): qty 1

## 2023-10-02 MED ORDER — CELECOXIB 200 MG PO CAPS
200.0000 mg | ORAL_CAPSULE | Freq: Two times a day (BID) | ORAL | Status: DC
  Administered 2023-10-02 – 2023-10-03 (×2): 200 mg via ORAL
  Filled 2023-10-02 (×2): qty 1

## 2023-10-02 MED ORDER — OXYCODONE HCL 5 MG PO TABS
ORAL_TABLET | ORAL | Status: AC
  Filled 2023-10-02: qty 1

## 2023-10-02 MED ORDER — FERROUS SULFATE 325 (65 FE) MG PO TABS
325.0000 mg | ORAL_TABLET | Freq: Two times a day (BID) | ORAL | Status: DC
  Administered 2023-10-02 – 2023-10-03 (×2): 325 mg via ORAL
  Filled 2023-10-02 (×2): qty 1

## 2023-10-02 MED ORDER — CELECOXIB 200 MG PO CAPS
400.0000 mg | ORAL_CAPSULE | Freq: Once | ORAL | Status: AC
  Administered 2023-10-02: 400 mg via ORAL

## 2023-10-02 MED ORDER — TRANEXAMIC ACID-NACL 1000-0.7 MG/100ML-% IV SOLN
1000.0000 mg | Freq: Once | INTRAVENOUS | Status: AC
  Administered 2023-10-02: 1000 mg via INTRAVENOUS

## 2023-10-02 MED ORDER — DEXAMETHASONE SODIUM PHOSPHATE 10 MG/ML IJ SOLN
8.0000 mg | Freq: Once | INTRAMUSCULAR | Status: AC
  Administered 2023-10-02: 8 mg via INTRAVENOUS

## 2023-10-02 MED ORDER — ONDANSETRON HCL 4 MG/2ML IJ SOLN
INTRAMUSCULAR | Status: AC
  Filled 2023-10-02: qty 2

## 2023-10-02 MED ORDER — ORAL CARE MOUTH RINSE: 15.0000 mL | Freq: Once | OROMUCOSAL | Status: AC

## 2023-10-02 MED ORDER — PANTOPRAZOLE SODIUM 40 MG PO TBEC
40.0000 mg | DELAYED_RELEASE_TABLET | Freq: Two times a day (BID) | ORAL | Status: DC
  Administered 2023-10-02 – 2023-10-03 (×2): 40 mg via ORAL
  Filled 2023-10-02 (×2): qty 1

## 2023-10-02 MED ORDER — CEFAZOLIN SODIUM-DEXTROSE 3-4 GM/150ML-% IV SOLN
3.0000 g | INTRAVENOUS | Status: AC
  Administered 2023-10-02: 3 g via INTRAVENOUS
  Filled 2023-10-02: qty 150

## 2023-10-02 MED ORDER — HYDROCHLOROTHIAZIDE 25 MG PO TABS
12.5000 mg | ORAL_TABLET | Freq: Every day | ORAL | Status: DC
  Administered 2023-10-02 – 2023-10-03 (×2): 12.5 mg via ORAL
  Filled 2023-10-02 (×2): qty 1

## 2023-10-02 MED ORDER — SURGIPHOR WOUND IRRIGATION SYSTEM - OPTIME
TOPICAL | Status: DC | PRN
Start: 2023-10-02 — End: 2023-10-02

## 2023-10-02 MED ORDER — PROPOFOL 1000 MG/100ML IV EMUL
INTRAVENOUS | Status: AC
  Filled 2023-10-02: qty 100

## 2023-10-02 MED ORDER — SODIUM CHLORIDE (PF) 0.9 % IJ SOLN
INTRAMUSCULAR | Status: AC
  Filled 2023-10-02: qty 40

## 2023-10-02 MED ORDER — SODIUM CHLORIDE 0.9 % IR SOLN
Status: DC | PRN
  Administered 2023-10-02: 3000 mL

## 2023-10-02 MED ORDER — PHENOL 1.4 % MT LIQD
1.0000 | OROMUCOSAL | Status: DC | PRN
Start: 2023-10-02 — End: 2023-10-03

## 2023-10-02 MED ORDER — BISACODYL 10 MG RE SUPP: 10.0000 mg | Freq: Every day | RECTAL | Status: DC | PRN

## 2023-10-02 MED ORDER — CHLORHEXIDINE GLUCONATE 0.12 % MT SOLN
OROMUCOSAL | Status: AC
  Filled 2023-10-02: qty 15

## 2023-10-02 MED ORDER — ACETAMINOPHEN 10 MG/ML IV SOLN
1000.0000 mg | Freq: Four times a day (QID) | INTRAVENOUS | Status: DC
  Administered 2023-10-02 – 2023-10-03 (×3): 1000 mg via INTRAVENOUS
  Filled 2023-10-02 (×2): qty 100

## 2023-10-02 MED ORDER — OXYCODONE HCL 5 MG/5ML PO SOLN: 5.0000 mg | Freq: Once | ORAL | Status: AC | PRN

## 2023-10-02 MED ORDER — GLYCOPYRROLATE 0.2 MG/ML IJ SOLN
INTRAMUSCULAR | Status: AC
  Filled 2023-10-02: qty 1

## 2023-10-02 MED ORDER — ROSUVASTATIN CALCIUM 10 MG PO TABS
10.0000 mg | ORAL_TABLET | ORAL | Status: DC
  Administered 2023-10-03: 10 mg via ORAL
  Filled 2023-10-02: qty 1

## 2023-10-02 MED ORDER — DROPERIDOL 2.5 MG/ML IJ SOLN: 0.6250 mg | Freq: Once | INTRAMUSCULAR | Status: DC | PRN

## 2023-10-02 MED ORDER — ONDANSETRON HCL 4 MG PO TABS: 4.0000 mg | ORAL_TABLET | Freq: Four times a day (QID) | ORAL | Status: DC | PRN

## 2023-10-02 MED ORDER — FENTANYL CITRATE (PF) 100 MCG/2ML IJ SOLN
INTRAMUSCULAR | Status: DC | PRN
  Administered 2023-10-02 (×2): 50 ug via INTRAVENOUS

## 2023-10-02 MED ORDER — BUPIVACAINE HCL (PF) 0.25 % IJ SOLN
INTRAMUSCULAR | Status: AC
  Filled 2023-10-02: qty 60

## 2023-10-02 MED ORDER — ACETAMINOPHEN 325 MG PO TABS: 325.0000 mg | ORAL_TABLET | Freq: Four times a day (QID) | ORAL | Status: DC | PRN

## 2023-10-02 MED ORDER — TRAMADOL HCL 50 MG PO TABS: 50.0000 mg | ORAL_TABLET | ORAL | Status: DC | PRN

## 2023-10-02 MED ORDER — OXYCODONE HCL 5 MG PO TABS
5.0000 mg | ORAL_TABLET | Freq: Once | ORAL | Status: AC | PRN
  Administered 2023-10-02: 5 mg via ORAL

## 2023-10-02 MED ORDER — FENTANYL CITRATE (PF) 100 MCG/2ML IJ SOLN
INTRAMUSCULAR | Status: AC
  Filled 2023-10-02: qty 2

## 2023-10-02 MED ORDER — ENSURE PRE-SURGERY PO LIQD
296.0000 mL | Freq: Once | ORAL | Status: DC
  Filled 2023-10-02: qty 296

## 2023-10-02 MED ORDER — FLEET ENEMA RE ENEM: 1.0000 | ENEMA | Freq: Once | RECTAL | Status: DC | PRN

## 2023-10-02 MED ORDER — BUPIVACAINE LIPOSOME 1.3 % IJ SUSP
INTRAMUSCULAR | Status: AC
  Filled 2023-10-02: qty 20

## 2023-10-02 MED ORDER — MAGNESIUM HYDROXIDE 400 MG/5ML PO SUSP
30.0000 mL | Freq: Every day | ORAL | Status: DC
Start: 2023-10-02 — End: 2023-10-03
  Administered 2023-10-03: 30 mL via ORAL
  Filled 2023-10-02: qty 30

## 2023-10-02 MED ORDER — BENAZEPRIL HCL 20 MG PO TABS
40.0000 mg | ORAL_TABLET | ORAL | Status: DC
  Filled 2023-10-02: qty 2

## 2023-10-02 MED ORDER — PROPOFOL 500 MG/50ML IV EMUL
INTRAVENOUS | Status: DC | PRN
  Administered 2023-10-02: 80 ug/kg/min via INTRAVENOUS

## 2023-10-02 MED ORDER — SODIUM CHLORIDE (PF) 0.9 % IJ SOLN
INTRAMUSCULAR | Status: DC | PRN
  Administered 2023-10-02: 120 mL via INTRAMUSCULAR

## 2023-10-02 MED ORDER — ACETAMINOPHEN 10 MG/ML IV SOLN
INTRAVENOUS | Status: DC | PRN
  Administered 2023-10-02: 1000 mg via INTRAVENOUS

## 2023-10-02 MED ORDER — BUPIVACAINE HCL (PF) 0.5 % IJ SOLN
INTRAMUSCULAR | Status: AC
  Filled 2023-10-02: qty 10

## 2023-10-02 MED ORDER — FENOFIBRATE 160 MG PO TABS
160.0000 mg | ORAL_TABLET | Freq: Every day | ORAL | Status: DC
  Administered 2023-10-03: 160 mg via ORAL
  Filled 2023-10-02: qty 1

## 2023-10-02 MED ORDER — PHENYLEPHRINE HCL-NACL 20-0.9 MG/250ML-% IV SOLN
INTRAVENOUS | Status: AC
  Filled 2023-10-02: qty 250

## 2023-10-02 MED ORDER — FENTANYL CITRATE (PF) 100 MCG/2ML IJ SOLN: 25.0000 ug | INTRAMUSCULAR | Status: DC | PRN

## 2023-10-02 MED ORDER — PROPOFOL 10 MG/ML IV BOLUS
INTRAVENOUS | Status: AC
  Filled 2023-10-02: qty 20

## 2023-10-02 MED ORDER — ACETAMINOPHEN 10 MG/ML IV SOLN
INTRAVENOUS | Status: AC
  Filled 2023-10-02: qty 100

## 2023-10-02 MED ORDER — PHENYLEPHRINE HCL-NACL 20-0.9 MG/250ML-% IV SOLN
INTRAVENOUS | Status: DC | PRN
  Administered 2023-10-02: 40 ug/min via INTRAVENOUS

## 2023-10-02 MED ORDER — ONDANSETRON HCL 4 MG/2ML IJ SOLN
4.0000 mg | Freq: Four times a day (QID) | INTRAMUSCULAR | Status: DC | PRN
Start: 2023-10-02 — End: 2023-10-03

## 2023-10-02 MED ORDER — SENNOSIDES-DOCUSATE SODIUM 8.6-50 MG PO TABS
1.0000 | ORAL_TABLET | Freq: Two times a day (BID) | ORAL | Status: DC
  Administered 2023-10-03: 1 via ORAL
  Filled 2023-10-02 (×2): qty 1

## 2023-10-02 MED ORDER — ALUM & MAG HYDROXIDE-SIMETH 200-200-20 MG/5ML PO SUSP: 30.0000 mL | ORAL | Status: DC | PRN

## 2023-10-02 MED ORDER — TRANEXAMIC ACID-NACL 1000-0.7 MG/100ML-% IV SOLN
1000.0000 mg | INTRAVENOUS | Status: AC
  Administered 2023-10-02: 1000 mg via INTRAVENOUS

## 2023-10-02 MED ORDER — CEFAZOLIN SODIUM-DEXTROSE 2-4 GM/100ML-% IV SOLN
2.0000 g | Freq: Four times a day (QID) | INTRAVENOUS | Status: AC
  Administered 2023-10-02 (×2): 2 g via INTRAVENOUS
  Filled 2023-10-02 (×2): qty 100

## 2023-10-02 MED ORDER — GABAPENTIN 300 MG PO CAPS
ORAL_CAPSULE | ORAL | Status: AC
  Filled 2023-10-02: qty 1

## 2023-10-02 MED ORDER — DIPHENHYDRAMINE HCL 12.5 MG/5ML PO ELIX: 12.5000 mg | ORAL_SOLUTION | ORAL | Status: DC | PRN

## 2023-10-02 MED ORDER — ASPIRIN 81 MG PO CHEW
81.0000 mg | CHEWABLE_TABLET | Freq: Two times a day (BID) | ORAL | Status: DC
  Administered 2023-10-02 – 2023-10-03 (×2): 81 mg via ORAL
  Filled 2023-10-02 (×2): qty 1

## 2023-10-02 MED ORDER — TRANEXAMIC ACID-NACL 1000-0.7 MG/100ML-% IV SOLN
INTRAVENOUS | Status: AC
  Filled 2023-10-02: qty 100

## 2023-10-02 MED ORDER — CHLORHEXIDINE GLUCONATE 0.12 % MT SOLN
15.0000 mL | Freq: Once | OROMUCOSAL | Status: AC
  Administered 2023-10-02: 15 mL via OROMUCOSAL

## 2023-10-02 MED ORDER — MIDAZOLAM HCL 5 MG/5ML IJ SOLN
INTRAMUSCULAR | Status: DC | PRN
  Administered 2023-10-02: 2 mg via INTRAVENOUS

## 2023-10-02 MED ORDER — HYDROMORPHONE HCL 1 MG/ML IJ SOLN: 0.5000 mg | INTRAMUSCULAR | Status: DC | PRN

## 2023-10-02 MED ORDER — BUPIVACAINE HCL (PF) 0.5 % IJ SOLN
INTRAMUSCULAR | Status: DC | PRN
  Administered 2023-10-02: 3 mL via INTRATHECAL

## 2023-10-02 MED ORDER — LACTATED RINGERS IV SOLN: INTRAVENOUS | Status: DC

## 2023-10-02 MED ORDER — OXYCODONE HCL 5 MG PO TABS: 10.0000 mg | ORAL_TABLET | ORAL | Status: DC | PRN

## 2023-10-02 MED ORDER — CHLORHEXIDINE GLUCONATE 4 % EX SOLN
60.0000 mL | Freq: Once | CUTANEOUS | Status: AC
  Administered 2023-10-02: 4 via TOPICAL

## 2023-10-02 MED ORDER — MIDAZOLAM HCL 2 MG/2ML IJ SOLN
INTRAMUSCULAR | Status: AC
Start: 2023-10-02 — End: ?
  Filled 2023-10-02: qty 2

## 2023-10-02 MED ORDER — OXYCODONE HCL 5 MG PO TABS: 5.0000 mg | ORAL_TABLET | ORAL | Status: DC | PRN

## 2023-10-02 MED ORDER — ONDANSETRON HCL 4 MG/2ML IJ SOLN
INTRAMUSCULAR | Status: DC | PRN
Start: 2023-10-02 — End: 2023-10-02
  Administered 2023-10-02: 4 mg via INTRAVENOUS

## 2023-10-02 MED ORDER — DEXAMETHASONE SODIUM PHOSPHATE 10 MG/ML IJ SOLN
INTRAMUSCULAR | Status: AC
  Filled 2023-10-02: qty 1

## 2023-10-02 MED ORDER — CELECOXIB 200 MG PO CAPS
ORAL_CAPSULE | ORAL | Status: AC
  Filled 2023-10-02: qty 2

## 2023-10-02 MED ORDER — MENTHOL 3 MG MT LOZG: 1.0000 | LOZENGE | OROMUCOSAL | Status: DC | PRN

## 2023-10-02 MED ORDER — ACETAMINOPHEN 10 MG/ML IV SOLN: 1000.0000 mg | Freq: Once | INTRAVENOUS | Status: DC | PRN

## 2023-10-02 MED ORDER — GABAPENTIN 300 MG PO CAPS
300.0000 mg | ORAL_CAPSULE | Freq: Once | ORAL | Status: AC
  Administered 2023-10-02: 300 mg via ORAL

## 2023-10-02 MED ORDER — SODIUM CHLORIDE 0.9 % IV SOLN: INTRAVENOUS | Status: DC

## 2023-10-02 SURGICAL SUPPLY — 66 items
ATTUNE MED DOME PAT 41 KNEE (Knees) IMPLANT
ATTUNE PS FEM RT SZ 6 CEM KNEE (Femur) IMPLANT
ATTUNE PSRP INSR SZ6 5 KNEE (Insert) IMPLANT
BASE TIBIAL ROT PLAT SZ 8 KNEE (Knees) IMPLANT
BATTERY INSTRU NAVIGATION (MISCELLANEOUS) ×4 IMPLANT
BIT DRILL QUICK REL 1/8 2PK SL (BIT) ×1 IMPLANT
BLADE CLIPPER SURG (BLADE) IMPLANT
BLADE SAW 70X12.5 (BLADE) ×1 IMPLANT
BLADE SAW 90X13X1.19 OSCILLAT (BLADE) ×1 IMPLANT
BLADE SAW 90X25X1.19 OSCILLAT (BLADE) ×1 IMPLANT
BNDG ESMARCH 6X12 STRL LF (GAUZE/BANDAGES/DRESSINGS) IMPLANT
BRUSH SCRUB EZ PLAIN DRY (MISCELLANEOUS) ×1 IMPLANT
CEMENT BONE GENTAMICIN 40 (Cement) IMPLANT
COOLER POLAR GLACIER W/PUMP (MISCELLANEOUS) ×1 IMPLANT
CUFF TRNQT CYL 24X4X16.5-23 (TOURNIQUET CUFF) IMPLANT
CUFF TRNQT CYL 30X4X21-28X (TOURNIQUET CUFF) IMPLANT
DRAPE EXTREMITY 106X87X128.5 (DRAPES) IMPLANT
DRAPE SHEET LG 3/4 BI-LAMINATE (DRAPES) ×1 IMPLANT
DRSG AQUACEL AG ADV 3.5X14 (GAUZE/BANDAGES/DRESSINGS) ×1 IMPLANT
DRSG MEPILEX SACRM 8.7X9.8 (GAUZE/BANDAGES/DRESSINGS) ×1 IMPLANT
DRSG TEGADERM 4X4.75 (GAUZE/BANDAGES/DRESSINGS) ×1 IMPLANT
DURAPREP 26ML APPLICATOR (WOUND CARE) ×2 IMPLANT
ELECT CAUTERY BLADE 6.4 (BLADE) ×1 IMPLANT
ELECTRODE REM PT RTRN 9FT ADLT (ELECTROSURGICAL) ×1 IMPLANT
EVACUATOR 1/8 PVC DRAIN (DRAIN) ×1 IMPLANT
EX-PIN ORTHOLOCK NAV 4X150 (PIN) ×2 IMPLANT
GAUZE XEROFORM 1X8 LF (GAUZE/BANDAGES/DRESSINGS) ×1 IMPLANT
GLOVE BIOGEL M STRL SZ7.5 (GLOVE) ×6 IMPLANT
GLOVE BIOGEL PI IND STRL 8 (GLOVE) ×1 IMPLANT
GLOVE SRG 8 PF TXTR STRL LF DI (GLOVE) ×1 IMPLANT
GOWN STRL REUS W/ TWL LRG LVL3 (GOWN DISPOSABLE) ×1 IMPLANT
GOWN STRL REUS W/ TWL XL LVL3 (GOWN DISPOSABLE) ×1 IMPLANT
GOWN TOGA ZIPPER T7+ PEEL AWAY (MISCELLANEOUS) ×1 IMPLANT
HOLDER FOLEY CATH W/STRAP (MISCELLANEOUS) ×1 IMPLANT
HOOD PEEL AWAY T7 (MISCELLANEOUS) ×1 IMPLANT
KIT TURNOVER KIT A (KITS) ×1 IMPLANT
KNIFE SCULPS 14X20 (INSTRUMENTS) ×1 IMPLANT
MANIFOLD NEPTUNE II (INSTRUMENTS) ×2 IMPLANT
NDL SPNL 20GX3.5 QUINCKE YW (NEEDLE) ×2 IMPLANT
NEEDLE SPNL 20GX3.5 QUINCKE YW (NEEDLE) ×2 IMPLANT
PACK HIP PROSTHESIS (MISCELLANEOUS) IMPLANT
PACK TOTAL KNEE (MISCELLANEOUS) ×1 IMPLANT
PAD ABD DERMACEA PRESS 5X9 (GAUZE/BANDAGES/DRESSINGS) ×2 IMPLANT
PAD ARMBOARD POSITIONER FOAM (MISCELLANEOUS) ×3 IMPLANT
PAD WRAPON POLAR KNEE (MISCELLANEOUS) ×1 IMPLANT
PENCIL SMOKE EVACUATOR COATED (MISCELLANEOUS) ×1 IMPLANT
PIN DRILL FIX HALF THREAD (BIT) ×2 IMPLANT
PIN FIXATION 1/8DIA X 3INL (PIN) ×1 IMPLANT
SOL .9 NS 3000ML IRR UROMATIC (IV SOLUTION) ×1 IMPLANT
SOLUTION IRRIG SURGIPHOR (IV SOLUTION) ×1 IMPLANT
SPONGE DRAIN TRACH 4X4 STRL 2S (GAUZE/BANDAGES/DRESSINGS) ×1 IMPLANT
STAPLER SKIN PROX 35W (STAPLE) ×1 IMPLANT
STOCKINETTE IMPERV 14X48 (MISCELLANEOUS) ×1 IMPLANT
STOCKINETTE STRL BIAS CUT 8X4 (MISCELLANEOUS) ×1 IMPLANT
STRAP TIBIA SHORT (MISCELLANEOUS) ×1 IMPLANT
SUCTION TUBE FRAZIER 10FR DISP (SUCTIONS) ×1 IMPLANT
SUT VIC AB 0 CT1 36 (SUTURE) ×1 IMPLANT
SUT VIC AB 1 CT1 36 (SUTURE) ×2 IMPLANT
SUT VIC AB 2-0 CT2 27 (SUTURE) ×1 IMPLANT
SYR 30ML LL (SYRINGE) ×2 IMPLANT
TIP FAN IRRIG PULSAVAC PLUS (DISPOSABLE) ×1 IMPLANT
TOWEL OR 17X26 4PK STRL BLUE (TOWEL DISPOSABLE) ×1 IMPLANT
TOWER CARTRIDGE SMART MIX (DISPOSABLE) ×1 IMPLANT
TRAP FLUID SMOKE EVACUATOR (MISCELLANEOUS) ×1 IMPLANT
TRAY FOLEY MTR SLVR 16FR STAT (SET/KITS/TRAYS/PACK) ×1 IMPLANT
WATER STERILE IRR 1000ML POUR (IV SOLUTION) ×1 IMPLANT

## 2023-10-02 NOTE — Progress Notes (Signed)
 Patient is not able to walk the distance required to go the bathroom, or he/she is unable to safely negotiate stairs required to access the bathroom.  A 3in1 BSC will alleviate this problem   Amenda Duclos P. Angie Fava M.D.

## 2023-10-02 NOTE — Evaluation (Signed)
 Physical Therapy Evaluation Patient Details Name: Ralph Giles MRN: [id number] DOB: 1955/07/06 Today's Date: 10/02/2023  History of Present Illness  68 y/o male s/p R TKA 10/02/23.  L TKA 3 months ago.  Clinical Impression  Pt pleasant and motivated for POD0 PT eval and treat.  He was still having some R LE numbness but displayed great quad strength and control.  He did very well with supine exercises showing very good quality of movement and was able to AROM bend knee >90.  PT also able to ambulate with consistent, symmetrical and confident cadence w/o excessive UE use on the walker.  Pt met or exceeded expected POD0 expectations.  Will benefit from continued PT per TKA protocols.        If plan is discharge home, recommend the following: Assist for transportation   Can travel by private vehicle        Equipment Recommendations None recommended by PT  Recommendations for Other Services       Functional Status Assessment Patient has had a recent decline in their functional status and demonstrates the ability to make significant improvements in function in a reasonable and predictable amount of time.     Precautions / Restrictions Precautions Precautions: Fall;Knee Precaution Booklet Issued: Yes (comment) (HEP) Restrictions Weight Bearing Restrictions Per Provider Order: Yes RLE Weight Bearing Per Provider Order: Weight bearing as tolerated      Mobility  Bed Mobility Overal bed mobility: Needs Assistance Bed Mobility: Supine to Sit     Supine to sit: Contact guard     General bed mobility comments: used rails to get to sitting, no assist needed    Transfers Overall transfer level: Needs assistance Equipment used: Rolling walker (2 wheels) Transfers: Sit to/from Stand Sit to Stand: Contact guard assist           General transfer comment: no dizziness or unsteadiness - able to rise w/o issue or physical assist    Ambulation/Gait Ambulation/Gait  assistance: Contact guard assist Gait Distance (Feet): 80 Feet Assistive device: Rolling walker (2 wheels)         General Gait Details: Pt was able to assume consistent and confident cadence with minimal reliance on UEs/AD.  No buckling, limp or hesitation.  Stairs            Wheelchair Mobility     Tilt Bed    Modified Rankin (Stroke Patients Only)       Balance Overall balance assessment: Modified Independent                                           Pertinent Vitals/Pain Pain Assessment Pain Assessment:  (reports mild pain, still with some numbness from the block)    Home Living Family/patient expects to be discharged to:: Private residence Living Arrangements: Spouse/significant other Available Help at Discharge: Family;Available 24 hours/day Type of Home: House Home Access: Stairs to enter Entrance Stairs-Rails: Can reach both;Right;Left Entrance Stairs-Number of Steps: 5   Home Layout: One level Home Equipment: Agricultural consultant (2 wheels);BSC/3in1;Shower seat - built in      Prior Function Prior Level of Function : Independent/Modified Independent             Mobility Comments: independent ADLs Comments: independent     Extremity/Trunk Assessment   Upper Extremity Assessment Upper Extremity Assessment: Overall WFL for tasks assessed (mild chronic shoulder pain)  Lower Extremity Assessment Lower Extremity Assessment: Overall WFL for tasks assessed (expected post op limitations)       Communication   Communication Communication: No apparent difficulties    Cognition Arousal: Alert Behavior During Therapy: WFL for tasks assessed/performed   PT - Cognitive impairments: No apparent impairments                         Following commands: Intact       Cueing       General Comments General comments (skin integrity, edema, etc.): Pt met or exceeded expected POD0 expectations    Exercises Total Joint  Exercises Ankle Circles/Pumps: AROM, 20 reps Quad Sets: Strengthening, 10 reps Short Arc Quad: AROM, 10 reps Heel Slides: AROM, 10 reps (with resisted leg extension) Hip ABduction/ADduction: Strengthening, 10 reps Straight Leg Raises: AROM, 10 reps Knee Flexion: AROM, 5 reps Goniometric ROM: 0-95 AROM   Assessment/Plan    PT Assessment Patient needs continued PT services  PT Problem List Decreased strength;Decreased range of motion;Decreased activity tolerance;Decreased balance;Decreased safety awareness;Pain;Decreased mobility       PT Treatment Interventions DME instruction;Gait training;Stair training;Functional mobility training;Therapeutic activities;Therapeutic exercise;Balance training;Patient/family education    PT Goals (Current goals can be found in the Care Plan section)  Acute Rehab PT Goals Patient Stated Goal: go home PT Goal Formulation: With patient Time For Goal Achievement: 10/15/23 Potential to Achieve Goals: Good    Frequency BID     Co-evaluation               AM-PAC PT "6 Clicks" Mobility  Outcome Measure Help needed turning from your back to your side while in a flat bed without using bedrails?: None Help needed moving from lying on your back to sitting on the side of a flat bed without using bedrails?: None Help needed moving to and from a bed to a chair (including a wheelchair)?: None Help needed standing up from a chair using your arms (e.g., wheelchair or bedside chair)?: None Help needed to walk in hospital room?: A Little Help needed climbing 3-5 steps with a railing? : A Little 6 Click Score: 22    End of Session Equipment Utilized During Treatment: Gait belt Activity Tolerance: Patient tolerated treatment well Patient left: with nursing/sitter in room;in chair Nurse Communication: Mobility status PT Visit Diagnosis: Muscle weakness (generalized) (M62.81);Difficulty in walking, not elsewhere classified (R26.2);Pain Pain - Right/Left:  Right Pain - part of body: Knee    Time: 1630-1700 PT Time Calculation (min) (ACUTE ONLY): 30 min   Charges:   PT Evaluation $PT Eval Low Complexity: 1 Low PT Treatments $Gait Training: 8-22 mins $Therapeutic Exercise: 8-22 mins PT General Charges $$ ACUTE PT VISIT: 1 Visit         Darice Edelman, DPT 10/02/2023, 5:13 PM

## 2023-10-02 NOTE — Transfer of Care (Signed)
 Immediate Anesthesia Transfer of Care Note  Patient: Ralph Giles  Procedure(s) Performed: ARTHROPLASTY, KNEE, TOTAL, USING IMAGELESS COMPUTER-ASSISTED NAVIGATION (Right: Knee)  Patient Location: PACU  Anesthesia Type:Spinal  Level of Consciousness: drowsy  Airway & Oxygen Therapy: Patient Spontanous Breathing and Patient connected to face mask oxygen  Post-op Assessment: Report given to RN and Post -op Vital signs reviewed and stable  Post vital signs: Reviewed and stable  Last Vitals:  Vitals Value Taken Time  BP 114/66 10/02/23 1534  Temp 36.2 1534  Pulse 77 10/02/23 1539  Resp 17 10/02/23 1539  SpO2 94 % 10/02/23 1539  Vitals shown include unfiled device data.  Last Pain:  Vitals:   10/02/23 0921  PainSc: 0-No pain         Complications: No notable events documented.

## 2023-10-02 NOTE — Progress Notes (Signed)
 Subjective: 1 Day Post-Op Procedure(s) (LRB): ARTHROPLASTY, KNEE, TOTAL, USING IMAGELESS COMPUTER-ASSISTED NAVIGATION (Right) Patient reports pain as mild.   Patient seen in rounds with Dr. Aubry Blase. Patient is well, and has had no acute complaints or problems Denies any CP, SOB, N/V, fevers or chills We will start therapy today.  Plan is to go Home after hospital stay.  Objective: Vital signs in last 24 hours: Temp:  [97.2 F (36.2 C)-98.1 F (36.7 C)] 98 F (36.7 C) (05/29 0744) Pulse Rate:  [61-86] 61 (05/29 0744) Resp:  [11-20] 16 (05/29 0744) BP: (97-149)/(56-91) 139/76 (05/29 0744) SpO2:  [91 %-100 %] 98 % (05/29 0744) Weight:  [129.4 kg] 129.4 kg (05/28 0921)  Intake/Output from previous day:  Intake/Output Summary (Last 24 hours) at 10/03/2023 0822 Last data filed at 10/03/2023 0439 Gross per 24 hour  Intake 1690 ml  Output 790 ml  Net 900 ml    Intake/Output this shift: No intake/output data recorded.  Labs: No results for input(s): "HGB" in the last 72 hours. No results for input(s): "WBC", "RBC", "HCT", "PLT" in the last 72 hours. No results for input(s): "NA", "K", "CL", "CO2", "BUN", "CREATININE", "GLUCOSE", "CALCIUM " in the last 72 hours. No results for input(s): "LABPT", "INR" in the last 72 hours.  EXAM General - Patient is Alert, Appropriate, and Oriented Extremity - Neurologically intact Neurovascular intact Sensation intact distally Intact pulses distally Dorsiflexion/Plantar flexion intact No cellulitis present Compartment soft Dressing - dressing C/D/I and no drainage Motor Function - intact, moving foot and toes well on exam. JP Drain was removed, intact  Past Medical History:  Diagnosis Date   Arthritis    right knee   Benign neoplasm of transverse colon    Degenerative arthritis of left knee    Dysmetabolic syndrome    Hyperlipidemia    Hypertension    Obesity    Rectal polyp    S/P lumbar fusion    Sleep apnea    does not use cpap    Spinal stenosis     Assessment/Plan: 1 Day Post-Op Procedure(s) (LRB): ARTHROPLASTY, KNEE, TOTAL, USING IMAGELESS COMPUTER-ASSISTED NAVIGATION (Right) Principal Problem:   History of total knee arthroplasty, right  Estimated body mass index is 40.92 kg/m as calculated from the following:   Height as of this encounter: 5\' 10"  (1.778 m).   Weight as of this encounter: 129.4 kg. Advance diet Up with therapy  Patient will continue to work with physical therapy to pass postoperative PT protocols, ROM and strengthening  Discussed with the patient continuing to utilize Polar Care  Patient will use bone foam in 20-30 minute intervals  Patient will wear TED hose bilaterally to help prevent DVT and clot formation  Discussed the Aquacel bandage.  This bandage will stay in place 7 days postoperatively.  Can be replaced with honeycomb bandages that will be sent home with the patient  Discussed sending the patient home with tramadol  and oxycodone  for as needed pain management.  Patient will also be sent home with Celebrex  to help with swelling and inflammation.  Patient will take an 81 mg aspirin  twice daily for DVT prophylaxis  JP drain was removed, intact  Weight-Bearing as tolerated to right leg  Patient will follow-up with [facility] in 2 weeks for staple removal and reevaluation  Wadie Guile, PA-C [facility] 10/03/2023, 8:22 AM

## 2023-10-02 NOTE — Interval H&P Note (Signed)
 History and Physical Interval Note:  10/02/2023 10:58 AM  Ralph Giles  has presented today for surgery, with the diagnosis of PRIMARY OSTEOARTHRITIS OF RIGHT KNEE..  The various methods of treatment have been discussed with the patient and family. After consideration of risks, benefits and other options for treatment, the patient has consented to  Procedure(s): ARTHROPLASTY, KNEE, TOTAL, USING IMAGELESS COMPUTER-ASSISTED NAVIGATION (Right) as a surgical intervention.  The patient's history has been reviewed, patient examined, no change in status, stable for surgery.  I have reviewed the patient's chart and labs.  Questions were answered to the patient's satisfaction.     Lamari Youngers P Jeovanni Heuring

## 2023-10-02 NOTE — Plan of Care (Signed)
  Problem: Education: Goal: Knowledge of the prescribed therapeutic regimen will improve Outcome: Progressing Goal: Individualized Educational Video(s) Outcome: Progressing   Problem: Activity: Goal: Ability to avoid complications of mobility impairment will improve Outcome: Progressing Goal: Range of joint motion will improve Outcome: Progressing   Problem: Clinical Measurements: Goal: Postoperative complications will be avoided or minimized Outcome: Progressing   Problem: Pain Management: Goal: Pain level will decrease with appropriate interventions Outcome: Progressing   Problem: Skin Integrity: Goal: Will show signs of wound healing Outcome: Progressing   Problem: Education: Goal: Knowledge of General Education information will improve Description: Including pain rating scale, medication(s)/side effects and non-pharmacologic comfort measures Outcome: Progressing   Problem: Health Behavior/Discharge Planning: Goal: Ability to manage health-related needs will improve Outcome: Progressing   Problem: Clinical Measurements: Goal: Ability to maintain clinical measurements within normal limits will improve Outcome: Progressing Goal: Will remain free from infection Outcome: Progressing Goal: Diagnostic test results will improve Outcome: Progressing Goal: Respiratory complications will improve Outcome: Progressing Goal: Cardiovascular complication will be avoided Outcome: Progressing   Problem: Activity: Goal: Risk for activity intolerance will decrease Outcome: Progressing   Problem: Nutrition: Goal: Adequate nutrition will be maintained Outcome: Progressing   Problem: Coping: Goal: Level of anxiety will decrease Outcome: Progressing   Problem: Elimination: Goal: Will not experience complications related to bowel motility Outcome: Progressing Goal: Will not experience complications related to urinary retention Outcome: Progressing   Problem: Pain  Managment: Goal: General experience of comfort will improve and/or be controlled Outcome: Progressing   Problem: Safety: Goal: Ability to remain free from injury will improve Outcome: Progressing   Problem: Skin Integrity: Goal: Risk for impaired skin integrity will decrease Outcome: Progressing

## 2023-10-02 NOTE — Op Note (Signed)
 OPERATIVE NOTE  DATE OF SURGERY:  10/02/2023  PATIENT NAME:  HERMES WAFER   DOB: 07/23/1955  MRN: [id number]  PRE-OPERATIVE DIAGNOSIS: Degenerative arthrosis of the right knee, primary  POST-OPERATIVE DIAGNOSIS:  Same  PROCEDURE:  Right total knee arthroplasty using computer-assisted navigation  SURGEON:  Maxene Span. M.D.  ASSISTANT:  Benjiman Bras, PA-C (present and scrubbed throughout the case, critical for assistance with exposure, retraction, instrumentation, and closure)  ANESTHESIA: spinal  ESTIMATED BLOOD LOSS: 50 mL  FLUIDS REPLACED: 800 mL of crystalloid  TOURNIQUET TIME: 115 minutes  DRAINS: 2 medium Hemovac drains  SOFT TISSUE RELEASES: Anterior cruciate ligament, posterior cruciate ligament, deep medial collateral ligament, patellofemoral ligament  IMPLANTS UTILIZED: DePuy Attune size 6 posterior stabilized femoral component (cemented), size 8 rotating platform tibial component (cemented), 41 mm medialized dome patella (cemented), and a 5 mm stabilized rotating platform polyethylene insert.  INDICATIONS FOR SURGERY: BRONC BROSSEAU is a 68 y.o. year old male with a long history of progressive knee pain. X-rays demonstrated severe degenerative changes in tricompartmental fashion. The patient had not seen any significant improvement despite conservative nonsurgical intervention. After discussion of the risks and benefits of surgical intervention, the patient expressed understanding of the risks benefits and agree with plans for total knee arthroplasty.   The risks, benefits, and alternatives were discussed at length including but not limited to the risks of infection, bleeding, nerve injury, stiffness, blood clots, the need for revision surgery, cardiopulmonary complications, among others, and they were willing to proceed.  PROCEDURE IN DETAIL: The patient was brought into the operating room and, after adequate spinal anesthesia was achieved, a tourniquet  was placed on the patient's upper thigh. The patient's knee and leg were cleaned and prepped with alcohol and DuraPrep and draped in the usual sterile fashion. A "timeout" was performed as per usual protocol. The lower extremity was exsanguinated using an Esmarch, and the tourniquet was inflated to 300 mmHg. An anterior longitudinal incision was made followed by a standard mid vastus approach. The deep fibers of the medial collateral ligament were elevated in a subperiosteal fashion off of the medial flare of the tibia so as to maintain a continuous soft tissue sleeve. The patella was subluxed laterally and the patellofemoral ligament was incised. Inspection of the knee demonstrated severe degenerative changes with full-thickness loss of articular cartilage. Osteophytes were debrided using a rongeur. Anterior and posterior cruciate ligaments were excised. Two 4.0 mm Schanz pins were inserted in the femur and into the tibia for attachment of the array of trackers used for computer-assisted navigation. Hip center was identified using a circumduction technique. Distal landmarks were mapped using the computer. The distal femur and proximal tibia were mapped using the computer. The distal femoral cutting guide was positioned using computer-assisted navigation so as to achieve a 5 distal valgus cut. The femur was sized and it was felt that a size 6 femoral component was appropriate. A size 6 femoral cutting guide was positioned and the anterior cut was performed and verified using the computer. This was followed by completion of the posterior and chamfer cuts. Femoral cutting guide for the central box was then positioned in the center box cut was performed.  Attention was then directed to the proximal tibia. Medial and lateral menisci were excised. The extramedullary tibial cutting guide was positioned using computer-assisted navigation so as to achieve a 0 varus-valgus alignment and 3 posterior slope. The cut was  performed and verified using the computer. The proximal tibia  was sized and it was felt that a size 8 tibial tray was appropriate. Tibial and femoral trials were inserted followed by insertion of a 5 mm polyethylene insert. This allowed for excellent mediolateral soft tissue balancing both in flexion and in full extension. Finally, the patella was cut and prepared so as to accommodate a 41 mm medialized dome patella. A patella trial was placed and the knee was placed through a range of motion with excellent patellar tracking appreciated. The femoral trial was removed after debridement of posterior osteophytes. The central post-hole for the tibial component was reamed followed by insertion of a keel punch. Tibial trials were then removed. Cut surfaces of bone were irrigated with copious amounts of normal saline using pulsatile lavage and then suctioned dry. Polymethylmethacrylate cement with gentamicin was prepared in the usual fashion using a vacuum mixer. Cement was applied to the cut surface of the proximal tibia as well as along the undersurface of a size 8 rotating platform tibial component. Tibial component was positioned and impacted into place. Excess cement was removed using Personal assistant. Cement was then applied to the cut surfaces of the femur as well as along the posterior flanges of the size 6 femoral component. The femoral component was positioned and impacted into place. Excess cement was removed using Personal assistant. A 5 mm polyethylene trial was inserted and the knee was brought into full extension with steady axial compression applied. Finally, cement was applied to the backside of a 41 mm medialized dome patella and the patellar component was positioned and patellar clamp applied. Excess cement was removed using Personal assistant. After adequate curing of the cement, the tourniquet was deflated after a total tourniquet time of 115 minutes. Hemostasis was achieved using electrocautery. The knee was  irrigated with copious amounts of normal saline using pulsatile lavage followed by 450 ml of Surgiphor and then suctioned dry. 20 mL of 1.3% Exparel  and 60 mL of 0.25% Marcaine  in 40 mL of normal saline was injected along the posterior capsule, medial and lateral gutters, and along the arthrotomy site. A 5 mm stabilized rotating platform polyethylene insert was inserted and the knee was placed through a range of motion with excellent mediolateral soft tissue balancing appreciated and excellent patellar tracking noted. 2 medium drains were placed in the wound bed and brought out through separate stab incisions. The medial parapatellar portion of the incision was reapproximated using interrupted sutures of #1 Vicryl. Subcutaneous tissue was approximated in layers using first #0 Vicryl followed #2-0 Vicryl. The skin was approximated with skin staples. A sterile dressing was applied.  The patient tolerated the procedure well and was transported to the recovery room in stable condition.    Armend Hochstatter P. Onetta Spainhower, Jr., M.D.

## 2023-10-02 NOTE — Plan of Care (Signed)
  Problem: Activity: Goal: Risk for activity intolerance will decrease Outcome: Progressing   Problem: Pain Managment: Goal: General experience of comfort will improve and/or be controlled Outcome: Progressing

## 2023-10-02 NOTE — Anesthesia Procedure Notes (Signed)
 Spinal  Patient location during procedure: OR Start time: 10/02/2023 11:27 AM End time: 10/02/2023 11:35 AM Reason for block: surgical anesthesia Staffing Performed: resident/CRNA  Resident/CRNA: Ellwood Haber, CRNA Performed by: Ellwood Haber, CRNA Authorized by: Baltazar Bonier, MD   Preanesthetic Checklist Completed: patient identified, IV checked, site marked, risks and benefits discussed, surgical consent, monitors and equipment checked, pre-op evaluation and timeout performed Spinal Block Patient position: sitting Prep: ChloraPrep Patient monitoring: heart rate, continuous pulse ox, blood pressure and cardiac monitor Approach: midline Location: L3-4 Injection technique: single-shot Needle Needle type: Introducer and Pencan  Needle gauge: 24 G Needle length: 10 cm Assessment Sensory level: T10 Events: CSF return Additional Notes Negative paresthesia. Negative blood return. Positive free-flowing CSF. Expiration date of kit checked and confirmed. Patient tolerated procedure well, without complications. Successful on first attempt.

## 2023-10-03 ENCOUNTER — Encounter: Payer: Self-pay | Admitting: Orthopedic Surgery

## 2023-10-03 DIAGNOSIS — Z7982 Long term (current) use of aspirin: Secondary | ICD-10-CM | POA: Diagnosis not present

## 2023-10-03 DIAGNOSIS — I10 Essential (primary) hypertension: Secondary | ICD-10-CM | POA: Diagnosis not present

## 2023-10-03 DIAGNOSIS — Z96652 Presence of left artificial knee joint: Secondary | ICD-10-CM | POA: Diagnosis not present

## 2023-10-03 DIAGNOSIS — Z96642 Presence of left artificial hip joint: Secondary | ICD-10-CM | POA: Diagnosis not present

## 2023-10-03 DIAGNOSIS — Z79899 Other long term (current) drug therapy: Secondary | ICD-10-CM | POA: Diagnosis not present

## 2023-10-03 DIAGNOSIS — M1711 Unilateral primary osteoarthritis, right knee: Secondary | ICD-10-CM | POA: Diagnosis not present

## 2023-10-03 MED ORDER — ASPIRIN 81 MG PO TBEC: 81.0000 mg | DELAYED_RELEASE_TABLET | Freq: Two times a day (BID) | ORAL | Status: AC

## 2023-10-03 MED ORDER — CELECOXIB 200 MG PO CAPS: 200.0000 mg | ORAL_CAPSULE | Freq: Two times a day (BID) | ORAL | 1 refills | Status: AC

## 2023-10-03 MED ORDER — TRAMADOL HCL 50 MG PO TABS: 50.0000 mg | ORAL_TABLET | ORAL | 0 refills | Status: AC | PRN

## 2023-10-03 MED ORDER — OXYCODONE HCL 5 MG PO TABS: 5.0000 mg | ORAL_TABLET | ORAL | 0 refills | Status: AC | PRN

## 2023-10-03 MED ORDER — ACETAMINOPHEN 10 MG/ML IV SOLN
INTRAVENOUS | Status: AC
Start: 2023-10-03 — End: ?
  Filled 2023-10-03: qty 100

## 2023-10-03 NOTE — Discharge Summary (Signed)
 Physician Discharge Summary  Subjective: 1 Day Post-Op Procedure(s) (LRB): ARTHROPLASTY, KNEE, TOTAL, USING IMAGELESS COMPUTER-ASSISTED NAVIGATION (Right) Patient reports pain as mild.   Patient seen in rounds with Dr. Aubry Blase. Patient is well, and has had no acute complaints or problems Denies any CP, SOB, N/V, fevers or chills We will start therapy today.  Patient is ready to go home  Physician Discharge Summary  Patient ID: Ralph Giles MRN: [id number] DOB/AGE: 02-09-1956 68 y.o.  Admit date: 10/02/2023 Discharge date: 10/03/2023  Admission Diagnoses:  Discharge Diagnoses:  Principal Problem:   History of total knee arthroplasty, right   Discharged Condition: good  Hospital Course: Patient presented to the hospital on 10/02/2023 for an elective right total knee arthroplasty. Patient was given 1g of TXA and 3g of Ancef  prior to the procedure. he  tolerated the procedure well without any complications. See procedural note below for details. Postoperatively, the patient did very well. he  was able to pass PT protocols on post-op day one without any issues. JP drain was removed without any difficulty and was intact. he  was able to void his bladder without any difficulty. Physical exam was unremarkable. he  denies any SOB, CP, N/V, fevers or chills. Vital signs are stable. Patient is stable to discharge home.  PROCEDURE:  Right total knee arthroplasty using computer-assisted navigation   SURGEON:  Maxene Span. M.D.   ASSISTANT:  Benjiman Bras, PA-C (present and scrubbed throughout the case, critical for assistance with exposure, retraction, instrumentation, and closure)   ANESTHESIA: spinal   ESTIMATED BLOOD LOSS: 50 mL   FLUIDS REPLACED: 800 mL of crystalloid   TOURNIQUET TIME: 115 minutes   DRAINS: 2 medium Hemovac drains   SOFT TISSUE RELEASES: Anterior cruciate ligament, posterior cruciate ligament, deep medial collateral ligament, patellofemoral ligament    IMPLANTS UTILIZED: DePuy Attune size 6 posterior stabilized femoral component (cemented), size 8 rotating platform tibial component (cemented), 41 mm medialized dome patella (cemented), and a 5 mm stabilized rotating platform polyethylene insert.  Treatments: none  Discharge Exam: Blood pressure 139/76, pulse 61, temperature 98 F (36.7 C), temperature source Oral, resp. rate 16, height 5\' 10"  (1.778 m), weight 129.4 kg, SpO2 98%.   Disposition: home   Allergies as of 10/03/2023       Reactions   Amlodipine Swelling   feet        Medication List     TAKE these medications    aspirin  EC 81 MG tablet Take 1 tablet (81 mg total) by mouth 2 (two) times daily. Swallow whole. What changed: when to take this   benazepril  40 MG tablet Commonly known as: LOTENSIN  Take 1 tablet (40 mg total) by mouth every morning.   celecoxib  200 MG capsule Commonly known as: CELEBREX  Take 1 capsule (200 mg total) by mouth 2 (two) times daily.   fenofibrate  145 MG tablet Commonly known as: TRICOR  Take 1 tablet (145 mg total) by mouth every morning.   Fish Oil  1000 MG Caps Take 1 capsule (1,000 mg total) by mouth daily.   gabapentin  300 MG capsule Commonly known as: NEURONTIN  Take 1 capsule (300 mg total) by mouth in the morning.   hydrochlorothiazide  12.5 MG tablet Commonly known as: HYDRODIURIL  TAKE 1 TABLET BY MOUTH EVERY DAY   oxyCODONE  5 MG immediate release tablet Commonly known as: Oxy IR/ROXICODONE  Take 1 tablet (5 mg total) by mouth every 4 (four) hours as needed for moderate pain (pain score 4-6) (pain score 4-6).  rosuvastatin  10 MG tablet Commonly known as: CRESTOR  Take 1 tablet (10 mg total) by mouth every morning.   traMADol  50 MG tablet Commonly known as: ULTRAM  Take 1-2 tablets (50-100 mg total) by mouth every 4 (four) hours as needed for moderate pain (pain score 4-6).               Durable Medical Equipment  (From admission, onward)            Start     Ordered   10/02/23 1759  DME Walker rolling  Once       Question:  Patient needs a walker to treat with the following condition  Answer:  Total knee replacement status   10/02/23 1758   10/02/23 1759  DME Bedside commode  Once       Comments: Patient is not able to walk the distance required to go the bathroom, or he/she is unable to safely negotiate stairs required to access the bathroom.  A 3in1 BSC will alleviate this problem  Question:  Patient needs a bedside commode to treat with the following condition  Answer:  Total knee replacement status   10/02/23 1758            Follow-up Information     Wadie Guile, PA-C Follow up on 10/17/2023.   Specialty: Orthopedic Surgery Why: at 9:45am Contact information: [address] [place] [address] [phone]         Arlyne Lame, MD Follow up on 11/14/2023.   Specialty: Orthopedic Surgery Why: at 9:45am Contact information: [address] [facility] [place] [address] [phone]                 Signed: Benjiman Bras 10/03/2023, 8:24 AM   Objective: Vital signs in last 24 hours: Temp:  [97.2 F (36.2 C)-98.1 F (36.7 C)] 98 F (36.7 C) (05/29 0744) Pulse Rate:  [61-86] 61 (05/29 0744) Resp:  [11-20] 16 (05/29 0744) BP: (97-149)/(56-91) 139/76 (05/29 0744) SpO2:  [91 %-100 %] 98 % (05/29 0744) Weight:  [129.4 kg] 129.4 kg (05/28 0921)  Intake/Output from previous day:  Intake/Output Summary (Last 24 hours) at 10/03/2023 0824 Last data filed at 10/03/2023 0439 Gross per 24 hour  Intake 1690 ml  Output 790 ml  Net 900 ml    Intake/Output this shift: No intake/output data recorded.  Labs: No results for input(s): "HGB" in the last 72 hours. No results for input(s): "WBC", "RBC", "HCT", "PLT" in the last 72 hours. No results for input(s): "NA", "K", "CL", "CO2", "BUN", "CREATININE", "GLUCOSE", "CALCIUM " in the last 72 hours. No results for input(s):  "LABPT", "INR" in the last 72 hours.  EXAM: General - Patient is Alert, Appropriate, and Oriented Extremity - Neurologically intact Neurovascular intact Sensation intact distally Intact pulses distally Dorsiflexion/Plantar flexion intact No cellulitis present Compartment soft Dressing - dressing C/D/I and no drainage Motor Function - intact, moving foot and toes well on exam. JP Drain was removed, intact  Assessment/Plan: 1 Day Post-Op Procedure(s) (LRB): ARTHROPLASTY, KNEE, TOTAL, USING IMAGELESS COMPUTER-ASSISTED NAVIGATION (Right) Procedure(s) (LRB): ARTHROPLASTY, KNEE, TOTAL, USING IMAGELESS COMPUTER-ASSISTED NAVIGATION (Right) Past Medical History:  Diagnosis Date   Arthritis    right knee   Benign neoplasm of transverse colon    Degenerative arthritis of left knee    Dysmetabolic syndrome    Hyperlipidemia    Hypertension    Obesity    Rectal polyp    S/P lumbar fusion    Sleep  apnea    does not use cpap   Spinal stenosis    Principal Problem:   History of total knee arthroplasty, right  Estimated body mass index is 40.92 kg/m as calculated from the following:   Height as of this encounter: 5\' 10"  (1.778 m).   Weight as of this encounter: 129.4 kg.  Patient will continue to work with physical therapy   Discussed with the patient continuing to utilize Polar Care   Patient will use bone foam in 20-30 minute intervals   Patient will wear TED hose bilaterally to help prevent DVT and clot formation   Discussed the Aquacel bandage.  This bandage will stay in place 7 days postoperatively.  Can be replaced with honeycomb bandages that will be sent home with the patient   Discussed sending the patient home with tramadol  and oxycodone  for as needed pain management.  Patient will also be sent home with Celebrex  to help with swelling and inflammation.  Patient will take an 81 mg aspirin  twice daily for DVT prophylaxis   JP drain was removed, intact   Weight-Bearing  as tolerated to right leg   Patient will follow-up with [facility] orthopedics in 2 weeks for staple removal and reevaluation  Diet - Regular diet Follow up - in 2 weeks Activity - WBAT Disposition - Home Condition Upon Discharge - Good DVT Prophylaxis - Aspirin  and TED hose  Standley Earing, PA-C Orthopaedic Surgery 10/03/2023, 8:24 AM

## 2023-10-03 NOTE — Evaluation (Signed)
 Occupational Therapy Evaluation Patient Details Name: Ralph Giles MRN: [id number] DOB: 03/24/1956 Today's Date: 10/03/2023   History of Present Illness   68 y/o male s/p R TKA 10/02/23.  L TKA 3 months ago.     Clinical Impressions Pt seen for OT evaluation this date, POD#1 from above surgery. PTA pt was MOD I-I in ADL/IADL. Pt and wife provided education re:  polar care mgt, fhome/routines modifications, DME/AE for LB bathing and dressing tasks, and compression stocking mgt. Pt is performing ADL/functional mobility at a supervision-MOD I level with RW except for LB dressing which they have AE for or wife will assist. Pt is left in bedside chair, all needs met. No further OT needs identified. OT will sign off. Please re consult if there is a change in functional status.    If plan is discharge home, recommend the following:   Assist for transportation;Assistance with cooking/housework;Help with stairs or ramp for entrance     Functional Status Assessment   Patient has had a recent decline in their functional status and demonstrates the ability to make significant improvements in function in a reasonable and predictable amount of time.     Equipment Recommendations   None recommended by OT (pt has recommended equipment)     Recommendations for Other Services         Precautions/Restrictions   Precautions Precautions: Fall;Knee Recall of Precautions/Restrictions: Intact Restrictions Weight Bearing Restrictions Per Provider Order: Yes RLE Weight Bearing Per Provider Order: Weight bearing as tolerated     Mobility Bed Mobility               General bed mobility comments: NT in recliner pre/post session    Transfers Overall transfer level: Needs assistance Equipment used: Rolling walker (2 wheels) Transfers: Sit to/from Stand Sit to Stand: Modified independent (Device/Increase time), Supervision                  Balance Overall balance  assessment: Needs assistance   Sitting balance-Leahy Scale: Good     Standing balance support: Bilateral upper extremity supported, During functional activity, Reliant on assistive device for balance Standing balance-Leahy Scale: Good                             ADL either performed or assessed with clinical judgement   ADL Overall ADL's : Needs assistance/impaired Eating/Feeding: Set up;Sitting   Grooming: Set up;Sitting;Standing               Lower Body Dressing: Supervision/safety;Maximal assistance Lower Body Dressing Details (indicate cue type and reason): supervision to doff sock, MAX A to donn Ted hose, wife will assist at home Toilet Transfer: Supervision/safety;Rolling walker (2 wheels);BSC/3in1   Toileting- Clothing Manipulation and Hygiene: Supervision/safety       Functional mobility during ADLs: Supervision/safety;Rolling walker (2 wheels) (approx 15' two attempts)       Vision Patient Visual Report: No change from baseline       Perception         Praxis         Pertinent Vitals/Pain Pain Assessment Pain Assessment: 0-10 Pain Score: 7  Pain Location: R knee Pain Descriptors / Indicators: Discomfort Pain Intervention(s): Premedicated before session, Ice applied, Repositioned, Monitored during session     Extremity/Trunk Assessment Upper Extremity Assessment Upper Extremity Assessment: Overall WFL for tasks assessed   Lower Extremity Assessment Lower Extremity Assessment: Defer to PT evaluation  Communication Communication Communication: No apparent difficulties   Cognition Arousal: Alert Behavior During Therapy: WFL for tasks assessed/performed Cognition: No apparent impairments                               Following commands: Intact       Cueing  General Comments   Cueing Techniques: Verbal cues  spo2 >90% on RA throughout   Exercises     Shoulder Instructions      Home Living  Family/patient expects to be discharged to:: Private residence Living Arrangements: Spouse/significant other Available Help at Discharge: Family;Available 24 hours/day Type of Home: House Home Access: Stairs to enter [employer] of Steps: 5 Entrance Stairs-Rails: Can reach both;Right;Left       Bathroom Shower/Tub: Producer, television/film/video: Handicapped height Bathroom Accessibility: Yes   Home Equipment: Agricultural consultant (2 wheels);BSC/3in1;Shower seat - built Designer, fashion/clothing: Reacher        Prior Functioning/Environment Prior Level of Function : Independent/Modified Independent                    OT Problem List:     OT Treatment/Interventions:        OT Goals(Current goals can be found in the care plan section)   Acute Rehab OT Goals Patient Stated Goal: go home OT Goal Formulation: With patient/family Time For Goal Achievement: 10/17/23 Potential to Achieve Goals: Good   OT Frequency:       Co-evaluation              AM-PAC OT "6 Clicks" Daily Activity     Outcome Measure Help from another person eating meals?: None Help from another person taking care of personal grooming?: None Help from another person toileting, which includes using toliet, bedpan, or urinal?: None Help from another person bathing (including washing, rinsing, drying)?: None Help from another person to put on and taking off regular upper body clothing?: None Help from another person to put on and taking off regular lower body clothing?: A Lot 6 Click Score: 22   End of Session Equipment Utilized During Treatment: Rolling walker (2 wheels) Nurse Communication: Mobility status  Activity Tolerance: Patient tolerated treatment well Patient left: in chair;with call bell/phone within reach;with family/visitor present  OT Visit Diagnosis: Other abnormalities of gait and mobility (R26.89)                Time: 2130-8657 OT Time  Calculation (min): 10 min Charges:  OT General Charges $OT Visit: 1 Visit OT Evaluation $OT Eval Low Complexity: 1 Low  Gerre Kraft, OTD OTR/L  10/03/23, 9:44 AM

## 2023-10-03 NOTE — TOC Transition Note (Signed)
 Transition of Care [facility]) - Discharge Note   Patient Details  Name: Ralph Giles MRN: [id number] Date of Birth: [birth year]  [facility]) CM/SW Contact:  Alexandra Ice, RN Phone Number: 10/03/2023, 8:35 AM   Clinical Narrative:     Patient to discharge today, home with home health services. [facility] set up by surgeon's office prior to surgeon. Agency information added to AVS. No TOC needs identified.   Final next level of care: Home w Home Health Services Barriers to Discharge: Barriers Resolved   Patient Goals and CMS Choice            Discharge Placement                  Name of family member notified: Blaise Bumps Patient and family notified of of transfer: 10/03/23  Discharge Plan and Services Additional resources added to the After Visit Summary for                    DME Agency: NA       HH Arranged: NA HH Agency: [facility] Date [facility] Agency Contacted: 10/03/23 Time HH Agency Contacted: [phone] Representative spoke with at [facility] Agency: [place]   Social Drivers of Health (SDOH) Interventions SDOH Screenings   Food Insecurity: No Food Insecurity (10/02/2023)  Housing: Low Risk  (10/02/2023)  Transportation Needs: No Transportation Needs (10/02/2023)  Utilities: Not At Risk (10/02/2023)  Depression (PHQ2-9): Low Risk  (06/17/2023)  Financial Resource Strain: Low Risk  (04/09/2023)   Received from [facility]  Social Connections: Socially Integrated (10/02/2023)  Tobacco Use: Low Risk  (10/02/2023)     Readmission Risk Interventions     No data to display

## 2023-10-03 NOTE — Progress Notes (Signed)
 DISCHARGE NOTE:  Pt and wife given discharge instructions, scripts, 2 honeycomb dressings and verbalized understanding. TED hose on both legs. Pt wheeled to car by staff, wife providing transportation.

## 2023-10-03 NOTE — Plan of Care (Signed)
See charting

## 2023-10-03 NOTE — Anesthesia Postprocedure Evaluation (Signed)
 Anesthesia Post Note  Patient: Ralph Giles  Procedure(s) Performed: ARTHROPLASTY, KNEE, TOTAL, USING IMAGELESS COMPUTER-ASSISTED NAVIGATION (Right: Knee)  Patient location during evaluation: PACU Anesthesia Type: Spinal Level of consciousness: oriented and awake and alert Pain management: pain level controlled Vital Signs Assessment: post-procedure vital signs reviewed and stable Respiratory status: spontaneous breathing, respiratory function stable and patient connected to nasal cannula oxygen Cardiovascular status: blood pressure returned to baseline and stable Postop Assessment: no headache, no backache and no apparent nausea or vomiting Anesthetic complications: no  No notable events documented.   Last Vitals:  Vitals:   10/02/23 1917 10/03/23 0435  BP: 118/69 (!) 97/56  Pulse: 74 70  Resp: 18 20  Temp: 36.4 C 36.6 C  SpO2: 96% 97%    Last Pain:  Vitals:   10/03/23 0435  TempSrc: Temporal  PainSc:                  [name]

## 2023-10-03 NOTE — Progress Notes (Signed)
 Physical Therapy Treatment Patient Details Name: Ralph Giles MRN: [id number] DOB: March 08, 1956 Today's Date: 10/03/2023   History of Present Illness 68 y/o male s/p R TKA 10/02/23.  L TKA 3 months ago.    PT Comments  Pt continues to do very with PT and displays great strength, ROM and mobility in general and especially AM of POD1.  He does have expected pain but is not functionally limited due to this.  He easily achieved >100* of AROM flexion, shows solid quad set and LE strength in general, maintained confident and consistent cadence for ~200 ft and managed steps w/o issue.  Pt overall doing well, will benefit from continued PT per TKA protocols.   If plan is discharge home, recommend the following: Assist for transportation   Can travel by private vehicle        Equipment Recommendations  None recommended by PT    Recommendations for Other Services       Precautions / Restrictions Precautions Precautions: Fall;Knee Precaution Booklet Issued:  (HEP) Restrictions RLE Weight Bearing Per Provider Order: Weight bearing as tolerated     Mobility  Bed Mobility               General bed mobility comments: in recliner pre and post session    Transfers Overall transfer level: Modified independent Equipment used: Rolling walker (2 wheels) Transfers: Sit to/from Stand Sit to Stand: Modified independent (Device/Increase time)           General transfer comment: good confidence, no assist needed    Ambulation/Gait Ambulation/Gait assistance: Supervision Gait Distance (Feet): 200 Feet Assistive device: Rolling walker (2 wheels)         General Gait Details: Pt was again able to assume consistent and confident cadence with minimal reliance on UEs/AD.  No buckling, limp or hesitation.  VSS   Stairs Stairs: Yes Stairs assistance: Modified independent (Device/Increase time) Stair Management: Two rails, Step to pattern Number of Stairs: 4 General stair  comments: Pt recalled appropriate strategy, did need mild cuing and education during execution but easily, safely and confidently negotiated up/down steps w/o phyiscal assist   Wheelchair Mobility     Tilt Bed    Modified Rankin (Stroke Patients Only)       Balance Overall balance assessment: Modified Independent                                          Communication Communication Communication: No apparent difficulties  Cognition Arousal: Alert Behavior During Therapy: WFL for tasks assessed/performed   PT - Cognitive impairments: No apparent impairments                         Following commands: Intact      Cueing    Exercises Total Joint Exercises Ankle Circles/Pumps: AROM, 10 reps Quad Sets: Strengthening, 10 reps Heel Slides: AROM, 10 reps Hip ABduction/ADduction: Strengthening, 10 reps Straight Leg Raises: AROM, 10 reps Long Arc Quad: Strengthening, 10 reps Knee Flexion: AROM, 5 reps Goniometric ROM: 0-108 AROM    General Comments General comments (skin integrity, edema, etc.): Pt continues to make great post-op gains      Pertinent Vitals/Pain Pain Assessment Pain Assessment: 0-10 Pain Score: 7  Pain Location: R knee    Home Living  Prior Function            PT Goals (current goals can now be found in the care plan section) Progress towards PT goals: Progressing toward goals    Frequency    BID      PT Plan      Co-evaluation              AM-PAC PT "6 Clicks" Mobility   Outcome Measure  Help needed turning from your back to your side while in a flat bed without using bedrails?: None Help needed moving from lying on your back to sitting on the side of a flat bed without using bedrails?: None Help needed moving to and from a bed to a chair (including a wheelchair)?: None Help needed standing up from a chair using your arms (e.g., wheelchair or bedside chair)?:  None Help needed to walk in hospital room?: None Help needed climbing 3-5 steps with a railing? : None 6 Click Score: 24    End of Session Equipment Utilized During Treatment: Gait belt Activity Tolerance: Patient tolerated treatment well Patient left: with nursing/sitter in room;in chair Nurse Communication: Mobility status PT Visit Diagnosis: Muscle weakness (generalized) (M62.81);Difficulty in walking, not elsewhere classified (R26.2);Pain Pain - Right/Left: Right Pain - part of body: Knee     Time: 9811-9147 PT Time Calculation (min) (ACUTE ONLY): 27 min  Charges:    $Gait Training: 8-22 mins $Therapeutic Exercise: 8-22 mins PT General Charges $$ ACUTE PT VISIT: 1 Visit                     Darice Edelman, DPT 10/03/2023, 9:12 AM

## 2023-10-04 DIAGNOSIS — I10 Essential (primary) hypertension: Secondary | ICD-10-CM | POA: Diagnosis not present

## 2023-10-04 DIAGNOSIS — Z860100 Personal history of colon polyps, unspecified: Secondary | ICD-10-CM | POA: Diagnosis not present

## 2023-10-04 DIAGNOSIS — G4730 Sleep apnea, unspecified: Secondary | ICD-10-CM | POA: Diagnosis not present

## 2023-10-04 DIAGNOSIS — Z471 Aftercare following joint replacement surgery: Secondary | ICD-10-CM | POA: Diagnosis not present

## 2023-10-04 DIAGNOSIS — Z96653 Presence of artificial knee joint, bilateral: Secondary | ICD-10-CM | POA: Diagnosis not present

## 2023-10-04 DIAGNOSIS — Z981 Arthrodesis status: Secondary | ICD-10-CM | POA: Diagnosis not present

## 2023-10-04 DIAGNOSIS — Z86018 Personal history of other benign neoplasm: Secondary | ICD-10-CM | POA: Diagnosis not present

## 2023-10-04 DIAGNOSIS — Z791 Long term (current) use of non-steroidal anti-inflammatories (NSAID): Secondary | ICD-10-CM | POA: Diagnosis not present

## 2023-10-04 DIAGNOSIS — Z7982 Long term (current) use of aspirin: Secondary | ICD-10-CM | POA: Diagnosis not present

## 2023-10-04 DIAGNOSIS — M4800 Spinal stenosis, site unspecified: Secondary | ICD-10-CM | POA: Diagnosis not present

## 2023-10-04 DIAGNOSIS — E785 Hyperlipidemia, unspecified: Secondary | ICD-10-CM | POA: Diagnosis not present

## 2023-10-06 DIAGNOSIS — Z471 Aftercare following joint replacement surgery: Secondary | ICD-10-CM | POA: Diagnosis not present

## 2023-10-06 DIAGNOSIS — G4730 Sleep apnea, unspecified: Secondary | ICD-10-CM | POA: Diagnosis not present

## 2023-10-06 DIAGNOSIS — I10 Essential (primary) hypertension: Secondary | ICD-10-CM | POA: Diagnosis not present

## 2023-10-06 DIAGNOSIS — Z860100 Personal history of colon polyps, unspecified: Secondary | ICD-10-CM | POA: Diagnosis not present

## 2023-10-06 DIAGNOSIS — Z86018 Personal history of other benign neoplasm: Secondary | ICD-10-CM | POA: Diagnosis not present

## 2023-10-06 DIAGNOSIS — Z96653 Presence of artificial knee joint, bilateral: Secondary | ICD-10-CM | POA: Diagnosis not present

## 2023-10-06 DIAGNOSIS — M4800 Spinal stenosis, site unspecified: Secondary | ICD-10-CM | POA: Diagnosis not present

## 2023-10-06 DIAGNOSIS — E785 Hyperlipidemia, unspecified: Secondary | ICD-10-CM | POA: Diagnosis not present

## 2023-10-06 DIAGNOSIS — Z7982 Long term (current) use of aspirin: Secondary | ICD-10-CM | POA: Diagnosis not present

## 2023-10-06 DIAGNOSIS — Z791 Long term (current) use of non-steroidal anti-inflammatories (NSAID): Secondary | ICD-10-CM | POA: Diagnosis not present

## 2023-10-06 DIAGNOSIS — Z981 Arthrodesis status: Secondary | ICD-10-CM | POA: Diagnosis not present

## 2023-10-08 DIAGNOSIS — Z96653 Presence of artificial knee joint, bilateral: Secondary | ICD-10-CM | POA: Diagnosis not present

## 2023-10-08 DIAGNOSIS — E785 Hyperlipidemia, unspecified: Secondary | ICD-10-CM | POA: Diagnosis not present

## 2023-10-08 DIAGNOSIS — G4730 Sleep apnea, unspecified: Secondary | ICD-10-CM | POA: Diagnosis not present

## 2023-10-08 DIAGNOSIS — M4800 Spinal stenosis, site unspecified: Secondary | ICD-10-CM | POA: Diagnosis not present

## 2023-10-08 DIAGNOSIS — Z86018 Personal history of other benign neoplasm: Secondary | ICD-10-CM | POA: Diagnosis not present

## 2023-10-08 DIAGNOSIS — Z791 Long term (current) use of non-steroidal anti-inflammatories (NSAID): Secondary | ICD-10-CM | POA: Diagnosis not present

## 2023-10-08 DIAGNOSIS — Z471 Aftercare following joint replacement surgery: Secondary | ICD-10-CM | POA: Diagnosis not present

## 2023-10-08 DIAGNOSIS — I10 Essential (primary) hypertension: Secondary | ICD-10-CM | POA: Diagnosis not present

## 2023-10-08 DIAGNOSIS — Z860100 Personal history of colon polyps, unspecified: Secondary | ICD-10-CM | POA: Diagnosis not present

## 2023-10-08 DIAGNOSIS — Z7982 Long term (current) use of aspirin: Secondary | ICD-10-CM | POA: Diagnosis not present

## 2023-10-08 DIAGNOSIS — Z981 Arthrodesis status: Secondary | ICD-10-CM | POA: Diagnosis not present

## 2023-10-11 DIAGNOSIS — I10 Essential (primary) hypertension: Secondary | ICD-10-CM | POA: Diagnosis not present

## 2023-10-11 DIAGNOSIS — Z471 Aftercare following joint replacement surgery: Secondary | ICD-10-CM | POA: Diagnosis not present

## 2023-10-11 DIAGNOSIS — Z860100 Personal history of colon polyps, unspecified: Secondary | ICD-10-CM | POA: Diagnosis not present

## 2023-10-11 DIAGNOSIS — E785 Hyperlipidemia, unspecified: Secondary | ICD-10-CM | POA: Diagnosis not present

## 2023-10-11 DIAGNOSIS — Z86018 Personal history of other benign neoplasm: Secondary | ICD-10-CM | POA: Diagnosis not present

## 2023-10-11 DIAGNOSIS — Z981 Arthrodesis status: Secondary | ICD-10-CM | POA: Diagnosis not present

## 2023-10-11 DIAGNOSIS — M4800 Spinal stenosis, site unspecified: Secondary | ICD-10-CM | POA: Diagnosis not present

## 2023-10-11 DIAGNOSIS — Z791 Long term (current) use of non-steroidal anti-inflammatories (NSAID): Secondary | ICD-10-CM | POA: Diagnosis not present

## 2023-10-11 DIAGNOSIS — G4730 Sleep apnea, unspecified: Secondary | ICD-10-CM | POA: Diagnosis not present

## 2023-10-11 DIAGNOSIS — Z96653 Presence of artificial knee joint, bilateral: Secondary | ICD-10-CM | POA: Diagnosis not present

## 2023-10-11 DIAGNOSIS — Z7982 Long term (current) use of aspirin: Secondary | ICD-10-CM | POA: Diagnosis not present

## 2023-10-14 DIAGNOSIS — M4800 Spinal stenosis, site unspecified: Secondary | ICD-10-CM | POA: Diagnosis not present

## 2023-10-14 DIAGNOSIS — I10 Essential (primary) hypertension: Secondary | ICD-10-CM | POA: Diagnosis not present

## 2023-10-14 DIAGNOSIS — Z471 Aftercare following joint replacement surgery: Secondary | ICD-10-CM | POA: Diagnosis not present

## 2023-10-14 DIAGNOSIS — G4730 Sleep apnea, unspecified: Secondary | ICD-10-CM | POA: Diagnosis not present

## 2023-10-14 DIAGNOSIS — Z96653 Presence of artificial knee joint, bilateral: Secondary | ICD-10-CM | POA: Diagnosis not present

## 2023-10-14 DIAGNOSIS — E785 Hyperlipidemia, unspecified: Secondary | ICD-10-CM | POA: Diagnosis not present

## 2023-10-14 DIAGNOSIS — Z981 Arthrodesis status: Secondary | ICD-10-CM | POA: Diagnosis not present

## 2023-10-14 DIAGNOSIS — Z860100 Personal history of colon polyps, unspecified: Secondary | ICD-10-CM | POA: Diagnosis not present

## 2023-10-14 DIAGNOSIS — Z791 Long term (current) use of non-steroidal anti-inflammatories (NSAID): Secondary | ICD-10-CM | POA: Diagnosis not present

## 2023-10-14 DIAGNOSIS — Z86018 Personal history of other benign neoplasm: Secondary | ICD-10-CM | POA: Diagnosis not present

## 2023-10-14 DIAGNOSIS — Z7982 Long term (current) use of aspirin: Secondary | ICD-10-CM | POA: Diagnosis not present

## 2023-10-16 DIAGNOSIS — Z981 Arthrodesis status: Secondary | ICD-10-CM | POA: Diagnosis not present

## 2023-10-16 DIAGNOSIS — M4800 Spinal stenosis, site unspecified: Secondary | ICD-10-CM | POA: Diagnosis not present

## 2023-10-16 DIAGNOSIS — Z860100 Personal history of colon polyps, unspecified: Secondary | ICD-10-CM | POA: Diagnosis not present

## 2023-10-16 DIAGNOSIS — G4730 Sleep apnea, unspecified: Secondary | ICD-10-CM | POA: Diagnosis not present

## 2023-10-16 DIAGNOSIS — Z471 Aftercare following joint replacement surgery: Secondary | ICD-10-CM | POA: Diagnosis not present

## 2023-10-16 DIAGNOSIS — Z96653 Presence of artificial knee joint, bilateral: Secondary | ICD-10-CM | POA: Diagnosis not present

## 2023-10-16 DIAGNOSIS — Z791 Long term (current) use of non-steroidal anti-inflammatories (NSAID): Secondary | ICD-10-CM | POA: Diagnosis not present

## 2023-10-16 DIAGNOSIS — I10 Essential (primary) hypertension: Secondary | ICD-10-CM | POA: Diagnosis not present

## 2023-10-16 DIAGNOSIS — Z7982 Long term (current) use of aspirin: Secondary | ICD-10-CM | POA: Diagnosis not present

## 2023-10-16 DIAGNOSIS — E785 Hyperlipidemia, unspecified: Secondary | ICD-10-CM | POA: Diagnosis not present

## 2023-10-16 DIAGNOSIS — Z86018 Personal history of other benign neoplasm: Secondary | ICD-10-CM | POA: Diagnosis not present

## 2023-10-17 DIAGNOSIS — R29898 Other symptoms and signs involving the musculoskeletal system: Secondary | ICD-10-CM | POA: Diagnosis not present

## 2023-10-17 DIAGNOSIS — M25661 Stiffness of right knee, not elsewhere classified: Secondary | ICD-10-CM | POA: Diagnosis not present

## 2023-10-17 DIAGNOSIS — Z96651 Presence of right artificial knee joint: Secondary | ICD-10-CM | POA: Diagnosis not present

## 2023-10-17 DIAGNOSIS — M25561 Pain in right knee: Secondary | ICD-10-CM | POA: Diagnosis not present

## 2023-10-17 DIAGNOSIS — G8929 Other chronic pain: Secondary | ICD-10-CM | POA: Diagnosis not present

## 2023-10-22 DIAGNOSIS — Z96651 Presence of right artificial knee joint: Secondary | ICD-10-CM | POA: Diagnosis not present

## 2023-10-22 DIAGNOSIS — R29898 Other symptoms and signs involving the musculoskeletal system: Secondary | ICD-10-CM | POA: Diagnosis not present

## 2023-10-22 DIAGNOSIS — G8929 Other chronic pain: Secondary | ICD-10-CM | POA: Diagnosis not present

## 2023-10-22 DIAGNOSIS — M25561 Pain in right knee: Secondary | ICD-10-CM | POA: Diagnosis not present

## 2023-10-22 DIAGNOSIS — M25661 Stiffness of right knee, not elsewhere classified: Secondary | ICD-10-CM | POA: Diagnosis not present

## 2023-10-24 DIAGNOSIS — R29898 Other symptoms and signs involving the musculoskeletal system: Secondary | ICD-10-CM | POA: Diagnosis not present

## 2023-10-24 DIAGNOSIS — Z96651 Presence of right artificial knee joint: Secondary | ICD-10-CM | POA: Diagnosis not present

## 2023-10-24 DIAGNOSIS — M25661 Stiffness of right knee, not elsewhere classified: Secondary | ICD-10-CM | POA: Diagnosis not present

## 2023-10-24 DIAGNOSIS — M25561 Pain in right knee: Secondary | ICD-10-CM | POA: Diagnosis not present

## 2023-10-24 DIAGNOSIS — G8929 Other chronic pain: Secondary | ICD-10-CM | POA: Diagnosis not present

## 2023-10-29 DIAGNOSIS — M25661 Stiffness of right knee, not elsewhere classified: Secondary | ICD-10-CM | POA: Diagnosis not present

## 2023-10-31 DIAGNOSIS — M25661 Stiffness of right knee, not elsewhere classified: Secondary | ICD-10-CM | POA: Diagnosis not present

## 2023-10-31 DIAGNOSIS — Z471 Aftercare following joint replacement surgery: Secondary | ICD-10-CM | POA: Diagnosis not present

## 2023-11-01 ENCOUNTER — Telehealth: Payer: Self-pay

## 2023-11-01 NOTE — Telephone Encounter (Signed)
 Copied from CRM [id number]. Topic: General - Other >> Nov 01, 2023  2:49 PM Sophia H wrote: Reason for CRM: Rojelio GLENWOOD Pierre  Calling in to check on request for office visit notes - faxed 06/26, do not see antthing on file. Confirmed fax # [phone]. Former patient of Dr. Joshua, will refax. Please be on the look out.

## 2023-11-04 DIAGNOSIS — M25661 Stiffness of right knee, not elsewhere classified: Secondary | ICD-10-CM | POA: Diagnosis not present

## 2023-11-04 DIAGNOSIS — Z96651 Presence of right artificial knee joint: Secondary | ICD-10-CM | POA: Diagnosis not present

## 2023-11-04 DIAGNOSIS — R29898 Other symptoms and signs involving the musculoskeletal system: Secondary | ICD-10-CM | POA: Diagnosis not present

## 2023-11-04 DIAGNOSIS — M25561 Pain in right knee: Secondary | ICD-10-CM | POA: Diagnosis not present

## 2023-11-04 DIAGNOSIS — G8929 Other chronic pain: Secondary | ICD-10-CM | POA: Diagnosis not present

## 2023-12-17 ENCOUNTER — Telehealth: Payer: Self-pay | Admitting: Physician Assistant

## 2023-12-17 DIAGNOSIS — M4316 Spondylolisthesis, lumbar region: Secondary | ICD-10-CM

## 2023-12-17 DIAGNOSIS — M5416 Radiculopathy, lumbar region: Secondary | ICD-10-CM

## 2023-12-17 DIAGNOSIS — Z981 Arthrodesis status: Secondary | ICD-10-CM

## 2023-12-17 MED ORDER — GABAPENTIN 300 MG PO CAPS: 300.0000 mg | ORAL_CAPSULE | Freq: Every morning | ORAL | 0 refills | Status: AC

## 2023-12-17 NOTE — Telephone Encounter (Signed)
 The patient states that they still take the gabapentin  (Neurontin ). He was unaware of the Neurontin  name, but says he definitely takes gabapentin .

## 2023-12-17 NOTE — Telephone Encounter (Signed)
 Is he still only taking neurontin  300mg  once a day?

## 2023-12-17 NOTE — Telephone Encounter (Signed)
 1 month supply of neurontin  sent to [facility] in [place]. As below, he should get further refills from PCP.

## 2023-12-17 NOTE — Telephone Encounter (Signed)
 Neurontin  refill from pharmacy. Per chart, he is not taking neurontin . Will send back denied.   Please call and make sure he is no longer taking neurontin . If he is, let me know.   Thanks!

## 2023-12-17 NOTE — Addendum Note (Signed)
 Addended byBETHA HILMA HASTINGS on: 12/17/2023 10:57 AM   Modules accepted: Orders

## 2023-12-17 NOTE — Telephone Encounter (Signed)
 Patient is taking the medication.  Informed that we can only supply him with 1 month and that further refills would need to come from PCP.

## 2023-12-17 NOTE — Telephone Encounter (Signed)
 Please verity that he is taking 300mg  of neurontin  once a day.   Let him know that I can send a 1 month refill to pharmacy, but he will need to get further refills from his PCP since he is not actively seeing us . His last visit was in January and he was released.   Thanks.

## 2024-01-14 ENCOUNTER — Telehealth: Payer: Self-pay | Admitting: Orthopedic Surgery

## 2024-01-14 DIAGNOSIS — Z981 Arthrodesis status: Secondary | ICD-10-CM

## 2024-01-14 DIAGNOSIS — M5416 Radiculopathy, lumbar region: Secondary | ICD-10-CM

## 2024-01-14 DIAGNOSIS — M4316 Spondylolisthesis, lumbar region: Secondary | ICD-10-CM

## 2024-01-14 NOTE — Telephone Encounter (Signed)
 He was last seen in January and he was released.   Advised last month he should get further refills of neurontin  from PCP. Please let him know.   Neurontin  sent back denied.

## 2024-01-14 NOTE — Telephone Encounter (Signed)
 Patient notified and states he did not need a refill but would contact his PCP if needed.
